# Patient Record
Sex: Female | Born: 1940 | Race: White | Hispanic: No | Marital: Married | Smoking: Never smoker
Health system: Southern US, Community
[De-identification: ages and names within clinical notes are randomized; demographics above are authoritative.]

## PROBLEM LIST (undated history)

## (undated) DIAGNOSIS — I499 Cardiac arrhythmia, unspecified: Secondary | ICD-10-CM

## (undated) DIAGNOSIS — R000 Tachycardia, unspecified: Secondary | ICD-10-CM

## (undated) DIAGNOSIS — J40 Bronchitis, not specified as acute or chronic: Secondary | ICD-10-CM

## (undated) DIAGNOSIS — R053 Chronic cough: Secondary | ICD-10-CM

## (undated) DIAGNOSIS — R05 Cough: Secondary | ICD-10-CM

## (undated) DIAGNOSIS — K5730 Diverticulosis of large intestine without perforation or abscess without bleeding: Secondary | ICD-10-CM

## (undated) DIAGNOSIS — I4891 Unspecified atrial fibrillation: Secondary | ICD-10-CM

## (undated) DIAGNOSIS — M1990 Unspecified osteoarthritis, unspecified site: Secondary | ICD-10-CM

## (undated) DIAGNOSIS — I10 Essential (primary) hypertension: Secondary | ICD-10-CM

## (undated) DIAGNOSIS — R519 Headache, unspecified: Secondary | ICD-10-CM

## (undated) DIAGNOSIS — K219 Gastro-esophageal reflux disease without esophagitis: Secondary | ICD-10-CM

## (undated) DIAGNOSIS — R011 Cardiac murmur, unspecified: Secondary | ICD-10-CM

## (undated) DIAGNOSIS — L719 Rosacea, unspecified: Secondary | ICD-10-CM

## (undated) DIAGNOSIS — R51 Headache: Secondary | ICD-10-CM

## (undated) DIAGNOSIS — R42 Dizziness and giddiness: Secondary | ICD-10-CM

## (undated) DIAGNOSIS — J189 Pneumonia, unspecified organism: Secondary | ICD-10-CM

## (undated) DIAGNOSIS — E7800 Pure hypercholesterolemia, unspecified: Secondary | ICD-10-CM

## (undated) DIAGNOSIS — G14 Postpolio syndrome: Secondary | ICD-10-CM

## (undated) DIAGNOSIS — K5790 Diverticulosis of intestine, part unspecified, without perforation or abscess without bleeding: Secondary | ICD-10-CM

## (undated) HISTORY — PX: COLON SURGERY: SHX602

## (undated) HISTORY — PX: EYE SURGERY: SHX253

## (undated) HISTORY — PX: TONSILLECTOMY: SUR1361

## (undated) HISTORY — PX: ABDOMINAL HYSTERECTOMY: SHX81

## (undated) HISTORY — PX: APPENDECTOMY: SHX54

## (undated) HISTORY — PX: OTHER SURGICAL HISTORY: SHX169

## (undated) HISTORY — DX: Postpolio syndrome: G14

## (undated) HISTORY — PX: COLON RESECTION: SHX5231

---

## 2003-12-04 ENCOUNTER — Emergency Department: Payer: Self-pay | Admitting: Emergency Medicine

## 2004-09-08 ENCOUNTER — Inpatient Hospital Stay: Payer: Self-pay | Admitting: Internal Medicine

## 2004-10-25 ENCOUNTER — Ambulatory Visit: Payer: Self-pay | Admitting: Unknown Physician Specialty

## 2007-03-28 ENCOUNTER — Ambulatory Visit: Payer: Self-pay | Admitting: Family Medicine

## 2007-11-05 ENCOUNTER — Emergency Department: Payer: Self-pay | Admitting: Emergency Medicine

## 2008-07-31 ENCOUNTER — Ambulatory Visit: Payer: Self-pay | Admitting: Internal Medicine

## 2008-11-21 ENCOUNTER — Ambulatory Visit: Payer: Self-pay | Admitting: Internal Medicine

## 2009-09-08 ENCOUNTER — Ambulatory Visit: Payer: Self-pay | Admitting: Internal Medicine

## 2009-11-12 ENCOUNTER — Ambulatory Visit: Payer: Self-pay | Admitting: Family Medicine

## 2010-07-26 ENCOUNTER — Ambulatory Visit: Payer: Self-pay | Admitting: Internal Medicine

## 2010-09-25 ENCOUNTER — Ambulatory Visit: Payer: Self-pay | Admitting: Internal Medicine

## 2010-11-22 ENCOUNTER — Ambulatory Visit: Payer: Self-pay | Admitting: Internal Medicine

## 2011-01-12 ENCOUNTER — Ambulatory Visit: Payer: Self-pay

## 2011-02-05 HISTORY — PX: HIP FRACTURE SURGERY: SHX118

## 2011-02-11 ENCOUNTER — Ambulatory Visit: Payer: Self-pay | Admitting: Family Medicine

## 2011-05-14 ENCOUNTER — Ambulatory Visit: Payer: Self-pay | Admitting: Family Medicine

## 2011-11-11 ENCOUNTER — Ambulatory Visit: Payer: Self-pay

## 2011-12-25 ENCOUNTER — Ambulatory Visit: Payer: Self-pay | Admitting: Ophthalmology

## 2012-01-01 ENCOUNTER — Ambulatory Visit: Payer: Self-pay | Admitting: Ophthalmology

## 2013-01-05 ENCOUNTER — Ambulatory Visit: Payer: Self-pay | Admitting: Family Medicine

## 2013-01-16 ENCOUNTER — Emergency Department: Payer: Self-pay | Admitting: Emergency Medicine

## 2013-04-10 ENCOUNTER — Emergency Department: Payer: Self-pay | Admitting: Internal Medicine

## 2013-04-13 LAB — BETA STREP CULTURE(ARMC)

## 2013-04-27 ENCOUNTER — Ambulatory Visit: Payer: Self-pay | Admitting: Emergency Medicine

## 2013-06-03 ENCOUNTER — Ambulatory Visit: Payer: Self-pay

## 2013-09-28 ENCOUNTER — Ambulatory Visit: Payer: Self-pay | Admitting: Family Medicine

## 2013-10-08 ENCOUNTER — Ambulatory Visit: Payer: Self-pay | Admitting: Family Medicine

## 2013-12-22 ENCOUNTER — Ambulatory Visit: Payer: Self-pay | Admitting: Physician Assistant

## 2013-12-22 LAB — URINALYSIS, COMPLETE
BILIRUBIN, UR: NEGATIVE
BLOOD: NEGATIVE
Bacteria: NEGATIVE
GLUCOSE, UR: NEGATIVE
Ketone: NEGATIVE
Leukocyte Esterase: NEGATIVE
NITRITE: NEGATIVE
Ph: 8.5 (ref 5.0–8.0)
Protein: NEGATIVE
SPECIFIC GRAVITY: 1.015 (ref 1.000–1.030)

## 2013-12-23 LAB — URINE CULTURE

## 2014-02-17 ENCOUNTER — Ambulatory Visit: Payer: Self-pay | Admitting: Family Medicine

## 2014-03-05 ENCOUNTER — Ambulatory Visit: Payer: Self-pay | Admitting: Emergency Medicine

## 2014-03-05 LAB — URINALYSIS, COMPLETE
Bilirubin,UR: NEGATIVE
Glucose,UR: NEGATIVE
KETONE: NEGATIVE
NITRITE: POSITIVE
PH: 5.5 (ref 5.0–8.0)
Protein: NEGATIVE
Specific Gravity: 1.015 (ref 1.000–1.030)
WBC UR: >30 /HPF (ref 0–5)

## 2014-03-07 LAB — URINE CULTURE

## 2014-05-24 NOTE — Op Note (Signed)
PATIENT NAME:  Kristin Nunez, Kristin Nunez MR#:  [id number] DATE OF BIRTH:  12-08-1940  DATE OF PROCEDURE:  01/01/2012  PROCEDURE PERFORMED:  1. Pars plana vitrectomy of the left eye.  2. Intravitreal injection of pharmacologic agent, left eye.   PREOPERATIVE DIAGNOSIS: Listed as vitreous hemorrhage.   POSTOPERATIVE DIAGNOSIS: Endophthalmitis.  PRIMARY SURGEON: Cline Cools, M.D.   ANESTHESIA: Local block of the left eye with monitored anesthesia care.   COMPLICATIONS: None.   INDICATIONS FOR PROCEDURE: This is a patient who presented to my office who had awakened three hours before with loss of vision in the left eye. Examination revealed trace cell in the anterior chamber and dense clouding of the posterior that seemed red in appearance consistent with vitreous hemorrhage. Risks, benefits, and alternatives of the above procedure were discussed and the patient wished to proceed.   DETAILS OF PROCEDURE: After informed consent was obtained, the patient was brought into the operative suite at [facility]. The patient was placed in the supine position, was given a small dose of propofol, and a local subconjunctival block was done on the left eye without any complications. The left eye was prepped and draped in sterile manner. After a lid speculum was inserted, the anterior chamber was examined and was noted to have significant fibrosis that had developed over the previous 1-1/2 hours. The patient was initially seen by me at 10:30. A 25-gauge blade was used to make a clear corneal wound inferotemporally. A second wound was created superonasally. The infusion line was inserted through the inferotemporal clear corneal wound and the vitrector was put through the other clear corneal wound and the fibrosis and cell was removed in order to clear the view of the back. The vitreous cutter and the infusion line were removed and the wounds were noted to be self-sealing watertight.  A trocar  was placed inferotemporally through displaced conjunctiva in an oblique fashion 3 mm beyond the limbus in the inferotemporal quadrant. The infusion cannula was turned on and inserted through the trocar and secured in position with Steri-Strips. Two more trocars were placed in a similar fashion superotemporally and superonasally. The vitreous cutter and light pipe were introduced into the eye and a core vitrectomy was performed. The vitreous face was confirmed to be elevated and the vitreous was trimmed for 360 degrees out to the vitreous base. The retina was investigated and was noted to be severely edematous with multiple hemorrhages. There were no signs of necrosis, retinal detachment, or retinal tears. The eye was examined with scleral depressed examination for 360 degrees and no signs of any breaks or tears could be found to the periphery. The infusion was confirmed to have the appropriate dosage of vancomycin in order to create 1 mg of vancomycin in the back of the eye. Trocars were removed and the wounds were closed using 6-0 plain gut. 2.25 mg of ceftazidime was injected via pars plana into the posterior. Pressure in the eye was confirmed to be approximately 10 to 15 mm mercury. Three mg of dexamethasone was given into the inferior fornix and the lid speculum was removed. The eye was cleaned and TobraDex was placed on the eye. A patch and shield were placed over the eye. The patient was taken to postanesthesia care with instructions to remain head up.      ____________________________ Ignacia Felling. Champ Mungo, MD mfa:bjt D: 01/01/2012 13:10:19 ET T: 01/01/2012 13:39:47 ET JOB#: [id number]  cc: Ignacia Felling. Champ Mungo, MD, <Dictator> Cline Cools MD  ELECTRONICALLY SIGNED 01/08/2012 6:55

## 2014-07-15 ENCOUNTER — Other Ambulatory Visit: Payer: Self-pay | Admitting: Surgery

## 2014-07-15 DIAGNOSIS — M1990 Unspecified osteoarthritis, unspecified site: Secondary | ICD-10-CM

## 2014-07-22 ENCOUNTER — Ambulatory Visit
Admission: RE | Admit: 2014-07-22 | Discharge: 2014-07-22 | Disposition: A | Discharge status: Home / Self Care | Payer: Medicare Other | Source: Ambulatory Visit | Attending: Surgery | Admitting: Surgery

## 2014-07-22 DIAGNOSIS — M1711 Unilateral primary osteoarthritis, right knee: Secondary | ICD-10-CM | POA: Insufficient documentation

## 2014-07-22 DIAGNOSIS — M1990 Unspecified osteoarthritis, unspecified site: Secondary | ICD-10-CM | POA: Diagnosis present

## 2014-07-22 DIAGNOSIS — M7121 Synovial cyst of popliteal space [Baker], right knee: Secondary | ICD-10-CM | POA: Insufficient documentation

## 2014-07-27 ENCOUNTER — Ambulatory Visit: Payer: Self-pay

## 2014-07-30 ENCOUNTER — Encounter: Payer: Self-pay | Admitting: Emergency Medicine

## 2014-07-30 ENCOUNTER — Ambulatory Visit
Admission: EM | Admit: 2014-07-30 | Discharge: 2014-07-30 | Disposition: A | Discharge status: Home / Self Care | Payer: Medicare Other | Attending: Family Medicine | Admitting: Family Medicine

## 2014-07-30 DIAGNOSIS — Z7982 Long term (current) use of aspirin: Secondary | ICD-10-CM | POA: Diagnosis not present

## 2014-07-30 DIAGNOSIS — I10 Essential (primary) hypertension: Secondary | ICD-10-CM | POA: Diagnosis not present

## 2014-07-30 DIAGNOSIS — N390 Urinary tract infection, site not specified: Secondary | ICD-10-CM | POA: Diagnosis not present

## 2014-07-30 DIAGNOSIS — R350 Frequency of micturition: Secondary | ICD-10-CM | POA: Diagnosis present

## 2014-07-30 DIAGNOSIS — I4891 Unspecified atrial fibrillation: Secondary | ICD-10-CM | POA: Insufficient documentation

## 2014-07-30 HISTORY — DX: Unspecified atrial fibrillation: I48.91

## 2014-07-30 HISTORY — DX: Tachycardia, unspecified: R00.0

## 2014-07-30 HISTORY — DX: Essential (primary) hypertension: I10

## 2014-07-30 HISTORY — DX: Diverticulosis of large intestine without perforation or abscess without bleeding: K57.30

## 2014-07-30 LAB — URINALYSIS COMPLETE WITH MICROSCOPIC (ARMC ONLY)
Bilirubin Urine: NEGATIVE
GLUCOSE, UA: NEGATIVE mg/dL
Ketones, ur: NEGATIVE mg/dL
NITRITE: NEGATIVE
PROTEIN: NEGATIVE mg/dL
SPECIFIC GRAVITY, URINE: 1.01 (ref 1.005–1.030)
pH: 6 (ref 5.0–8.0)

## 2014-07-30 MED ORDER — PHENAZOPYRIDINE HCL 100 MG PO TABS: 100.0000 mg | ORAL_TABLET | Freq: Three times a day (TID) | ORAL | Status: DC | PRN

## 2014-07-30 MED ORDER — NITROFURANTOIN MONOHYD MACRO 100 MG PO CAPS: 100.0000 mg | ORAL_CAPSULE | Freq: Two times a day (BID) | ORAL | Status: DC

## 2014-07-30 NOTE — ED Notes (Signed)
Patient presents here with c/o urinary frequency /flank pain / lower bad pain associated with burning micturation since 2 days, denies any fever

## 2014-07-30 NOTE — ED Provider Notes (Addendum)
CSN: [id number]     Arrival date & time 07/30/14  1402 History   First MD Initiated Contact with Patient 07/30/14 1440     Chief Complaint  Patient presents with  . Urinary Frequency   (Consider location/radiation/quality/duration/timing/severity/associated sxs/prior Treatment) HPI Comments: Caucasian female reported was intimate with spouse 2 days ago uses lubricant, perineum hygiene after sex, burning with urination, frequency, stronger smell to urine last UTI this winter pyridium and macrobid resolved all symptoms until now.  Has gyn appt scheduled routine for 29 Jun on premarin and vaginal cream.  Patient is a 74 y.o. female presenting with frequency. The history is provided by the patient.  Urinary Frequency This is a recurrent problem. The current episode started 2 days ago. The problem occurs constantly. The problem has been gradually worsening. Associated symptoms include abdominal pain. Pertinent negatives include no chest pain, no headaches and no shortness of breath. The symptoms are aggravated by intercourse and exertion. Nothing relieves the symptoms. She has tried rest, food and water for the symptoms. The treatment provided no relief.    Past Medical History  Diagnosis Date  . Hypertension   . Tachycardia   . Atrial fibrillation   . Diverticula of colon    Past Surgical History  Procedure Laterality Date  . Colon resection     History reviewed. No pertinent family history. History  Substance Use Topics  . Smoking status: Never Smoker   . Smokeless tobacco: Not on file  . Alcohol Use: No   OB History    No data available     Review of Systems  Constitutional: Positive for appetite change. Negative for fever, chills, diaphoresis, activity change and fatigue.  HENT: Negative for congestion, facial swelling and trouble swallowing.   Eyes: Negative for photophobia, pain, discharge, redness, itching and visual disturbance.  Respiratory: Negative for cough, choking,  shortness of breath and wheezing.   Cardiovascular: Negative for chest pain.  Gastrointestinal: Positive for abdominal pain. Negative for nausea, vomiting, diarrhea, constipation, blood in stool, abdominal distention, anal bleeding and rectal pain.  Endocrine: Negative for cold intolerance and heat intolerance.  Genitourinary: Positive for dysuria, urgency and frequency. Negative for hematuria, flank pain, decreased urine volume, vaginal bleeding, vaginal discharge, difficulty urinating, genital sores, vaginal pain, menstrual problem and pelvic pain.  Musculoskeletal: Negative for myalgias, back pain, joint swelling, arthralgias, gait problem, neck pain and neck stiffness.  Skin: Negative for color change, pallor, rash and wound.  Allergic/Immunologic: Positive for environmental allergies. Negative for food allergies.  Neurological: Negative for dizziness, tremors, facial asymmetry, weakness, light-headedness and headaches.  Hematological: Negative for adenopathy. Does not bruise/bleed easily.  Psychiatric/Behavioral: Negative for behavioral problems, confusion, sleep disturbance and agitation.    Allergies  Ciprofloxacin; Demerol; Flagyl; Meprednisone; Morphine and related; Promethazine; Sulfa antibiotics; and Toradol  Home Medications   Prior to Admission medications   Medication Sig Start Date End Date Taking? Authorizing Provider  aspirin EC 81 MG tablet Take 81 mg by mouth daily.   Yes Historical Provider, MD  diphenhydrAMINE (SOMINEX) 25 MG tablet Take 25 mg by mouth at bedtime as needed for sleep.   Yes Historical Provider, MD  meloxicam (MOBIC) 15 MG tablet Take 15 mg by mouth daily.   Yes Historical Provider, MD  metoprolol succinate (TOPROL-XL) 100 MG 24 hr tablet Take 100 mg by mouth daily. Take with or immediately following a meal.   Yes Historical Provider, MD  nitrofurantoin, macrocrystal-monohydrate, (MACROBID) 100 MG capsule Take 1 capsule (100 mg total)  by mouth 2 (two) times  daily. 07/30/14   Barbaraann Barthel, NP  phenazopyridine (PYRIDIUM) 100 MG tablet Take 1 tablet (100 mg total) by mouth 3 (three) times daily as needed for pain. 07/30/14   Jarold Song Betancourt, NP   BP 142/71 mmHg  Pulse 71  Temp(Src) 96.6 F (35.9 C) (Tympanic)  Resp 20  Ht 5\' 6"  (1.676 m)  Wt 161 lb (73.029 kg)  BMI 26.00 kg/m2  SpO2 100% Physical Exam  Constitutional: She is oriented to person, place, and time. Vital signs are normal. She appears well-developed and well-nourished.  HENT:  Head: Normocephalic and atraumatic.  Right Ear: External ear normal.  Left Ear: External ear normal.  Nose: Nose normal.  Mouth/Throat: Oropharynx is clear and moist. No oropharyngeal exudate.  Eyes: Conjunctivae, EOM and lids are normal. Pupils are equal, round, and reactive to light. Right eye exhibits no discharge. Left eye exhibits no discharge. No scleral icterus.  Neck: Normal range of motion. Neck supple. No tracheal deviation present. No thyromegaly present.  Cardiovascular: Normal rate, regular rhythm, normal heart sounds and intact distal pulses.  Exam reveals no gallop and no friction rub.   No murmur heard. Pulmonary/Chest: Effort normal and breath sounds normal. No stridor. No respiratory distress. She has no wheezes. She has no rales. She exhibits no tenderness.  Abdominal: Soft. Bowel sounds are normal. She exhibits no shifting dullness, no distension, no pulsatile liver, no fluid wave, no abdominal bruit, no ascites, no pulsatile midline mass and no mass. There is no hepatosplenomegaly. There is tenderness in the right lower quadrant, suprapubic area and left lower quadrant. There is CVA tenderness. There is no rigidity, no rebound, no guarding, no tenderness at McBurney's point and negative Murphy's sign. Hernia confirmed negative in the ventral area.  Dull to percussion x 4 quads; pressure/discomfort with RLQ/LLQ/suprapubic palpation no radiation/rebound; slight CVA discomfort bilaterally   Musculoskeletal: Normal range of motion.  Lymphadenopathy:    She has no cervical adenopathy.  Neurological: She is alert and oriented to person, place, and time. She exhibits normal muscle tone. Coordination normal.  Skin: Skin is warm, dry and intact.  Psychiatric: She has a normal mood and affect. Her speech is normal and behavior is normal. Judgment and thought content normal. Cognition and memory are normal.  Nursing note and vitals reviewed.   ED Course  Procedures (including critical care time) Labs Review Labs Reviewed  URINALYSIS COMPLETEWITH MICROSCOPIC ([facility] ONLY) - Abnormal; Notable for the following:    APPearance CLOUDY (*)    Hgb urine dipstick 2+ (*)    Leukocytes, UA 3+ (*)    Bacteria, UA MANY (*)    Squamous Epithelial / LPF 0-5 (*)    All other components within normal limits  URINE CULTURE    Imaging Review No results found.   MDM   1. UTI (lower urinary tract infection)   Plan: 1. Test/x-ray results and diagnosis reviewed with patient and given copies of lab reports 2. rx as per orders; risks, benefits, potential side effects reviewed with patient 3. Recommend supportive treatment with fluids, tylenol, pyridium, rest 4. F/u prn if symptoms worsen or don't improve within 48 hours of antibiotic use Medications as directed.  Patient is also to push fluids and may use Pyridium po as needed. Call or return to clinic as needed if these symptoms worsen or fail to improve as anticipated. Patient given copy of urinalysis results.  Discussed with patient as 3 UTIs in past 6 months  consider discussing preventive treatment with GYN or PCM.  Shower after sex.  Stay hydrated.  Void on regular basis do not hold urine when urge occurs. Urine culture results will be available in 48 hours and nurse would call if resistence to prescribed antibiotic.  Patient may call clinic Tuesday or Wednesday for results also. Exitcare handout on cystitis given to patient Patient verbalized  agreement and understanding of treatment plan and had no further questions at this time. P2:  Hydrate and cranberry juice    Barbaraann Barthel, NP 07/30/14 1559  14 Aug 2014 at 1645 patient notified via telephone urine culture results e. Coli suscecptible to nitrofurantoin.  Patient reported her symptoms had resolved.  She verbalized understanding of information and had no further questions at this time.  Barbaraann Barthel, NP 08/14/14 1645

## 2014-07-30 NOTE — Discharge Instructions (Signed)

## 2014-08-01 LAB — URINE CULTURE
Culture: >=100000
Special Requests: NORMAL

## 2014-10-07 ENCOUNTER — Ambulatory Visit: Payer: Medicare Other | Attending: Urology | Admitting: Physical Therapy

## 2014-10-07 ENCOUNTER — Encounter: Payer: Self-pay | Admitting: Physical Therapy

## 2014-10-07 DIAGNOSIS — R279 Unspecified lack of coordination: Secondary | ICD-10-CM | POA: Insufficient documentation

## 2014-10-07 DIAGNOSIS — R531 Weakness: Secondary | ICD-10-CM | POA: Insufficient documentation

## 2014-10-07 DIAGNOSIS — M629 Disorder of muscle, unspecified: Secondary | ICD-10-CM | POA: Diagnosis not present

## 2014-10-07 NOTE — Patient Instructions (Addendum)
      Increase 1 glass of water to 3 /day and decrease coffee from 3 to 2 /day

## 2014-10-08 NOTE — Therapy (Signed)
[facility] MAIN [facility] SERVICES [address] [place], [place], [address] Phone: [phone]   Fax:  [phone]  Physical Therapy Evaluation  Patient Details  Name: Kristin Nunez MRN: [id number] Date of Birth: 1940-09-04 Referring Provider:  Riki Altes, MD  Encounter Date: 10/07/2014      PT End of Session - 10/08/14 0832    Visit Number 1   Number of Visits 12   Date for PT Re-Evaluation 12/30/14   Authorization Type G-code 10th visit   PT Start Time 1020   PT Stop Time 1115   PT Time Calculation (min) 55 min   Activity Tolerance Patient tolerated treatment well;No increased pain   Behavior During Therapy [facility] for tasks assessed/performed      Past Medical History  Diagnosis Date  . Hypertension   . Tachycardia   . Atrial fibrillation   . Diverticula of colon   . 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 Colon resection    . Abdominal hysterectomy      prolapsed uterus   . Trigger thumb release       R  . Hip fracture surgery Right 2013    from a fall, also fx R shoulder: (plate/pins present in UE, pins LE)   . Eye surgery Left     cataract     There were no vitals filed for this visit.  Visit Diagnosis:  Fascial defect - Plan: PT plan of care cert/re-cert  Weakness - Plan: PT plan of care cert/re-cert  Lack of coordination - Plan: PT plan of care cert/re-cert      Subjective Assessment - 10/07/14 1033    Subjective  1) urge incontinence and frequency (1 hr) , leakage with sit-to stand  due to slowed rise from knee issues associated with Post-Polio, Pad change for fecal and urinary incontinece 2x/day, nocturia: 2x. Fluids: 3-4 cups coffee/day, 1 cup juice, 3 - 8 oz water/day  2) constipation: with Miralax, type 1 4-5 x/night with difficulty emptying.    Pertinent History Hx of colon resection (to treat diverticulitis) and has had only one attack  since post-surgery, Hx of complete hysterectemy, post-polio, R hip surgery from Fx,.  Current routine: 45-90 min swimming 3x/wk    Patient Stated Goals increase mm strength / control to make it to the bathroom             [facility] PT Assessment - 10/08/14 0824    Observation/Other Assessments   Other Surveys  --  PFDI: 54.5% (lower % indicate better function)   Sit to Stand   Comments 32:23 sec  genu valgus and heavy use of UE support on arm, breathholding   Posture/Postural Control   Posture Comments chest breathing dominant   Strength   Overall Strength Comments L LE 3/5, R4/5    Palpation   Palpation comment LQ abdominal scar poor immobility   Ambulation/Gait   Gait Comments mild scissoring w/ mild LBO w/ ability to self-correct                   [facility] Adult PT Treatment/Exercise - 10/08/14 0824    Self-Care   Other Self-Care Comments  bladder irritants to water ration w/ education to avoid dehydration, hydration is important for resolving her Sx   Therapeutic Activites    ADL's proper toileting posture and breathing   Neuro  Re-ed    Neuro Re-ed Details  sit to stand , exhale on rise                PT Education - 11-07-14 0829    Education provided Yes   Education Details HEP, POC, anatomy, physiology, goals   Person(s) Educated Patient   Methods Explanation;Demonstration;Tactile cues;Verbal cues;Handout   Comprehension Verbalized understanding;Returned demonstration             PT Long Term Goals - Nov 07, 2014 1610    PT LONG TERM GOAL #1   Title Pt will decrease her score on PFDI from 53.5% to less than 45% to demo improved pelvic floor function to perform ADLs.   Time 12   Period Weeks   Status New   PT LONG TERM GOAL #2   Title Pt will decrease her time with sit-to-stand from 32 sec to < 30 sec with UE support in order to access her toilet w/ decreased risk for falls.   Time 12   Period Weeks   Status New   PT LONG TERM GOAL #3   Title  Pt will report compliance with toileting posture and bladder irritant to water ratio recommendations to improve urinary frequency/ constipation and decrease risk of dehydration/hospitalization.    Time 12   Period Weeks   Status New               Plan - Nov 07, 2014 9604    Clinical Impression Statement Pt is a 74 yo female who c/o pelvic floor dysfunctions w/ urinary and bowel Sx (urge incontinence, SUI, difficulty completely emptying bowels, constipation) . Her S & Sx consist of weak hip strength, difficulty w/ sit-to-stand, poor bladder health and toileting habits,  limited diaphragmatic breathing due to dominant chest breathing, and restricted fascia over abdomen.   Internal pelvic floor exam will be performed at next visit due to limited time from pt's late arrival. These deficits impact her sleep and balance as she has frequent urination and are dependent on pads. Pt is at risk for falls.      Pt will benefit from skilled therapeutic intervention in order to improve on the following deficits Abnormal gait;Decreased activity tolerance;Decreased balance;Decreased mobility;Decreased strength;Postural dysfunction;Improper body mechanics;Decreased scar mobility;Hypomobility;Pain;Difficulty walking;Decreased coordination;Decreased range of motion;Decreased safety awareness;Decreased endurance;Increased fascial restricitons   Rehab Potential Good   Clinical Impairments Affecting Rehab Potential Pt's Hx of hysterectemy due to pelvic organ prolapse, post-polio syndrome, and colon resection surgery    PT Frequency 1x / week   PT Duration 12 weeks   PT Treatment/Interventions ADLs/Self Care Home Management;Cryotherapy;Biofeedback;Electrical Stimulation;Functional mobility training;Stair training;Gait training;Traction;Moist Heat;Therapeutic activities;Therapeutic exercise;Balance training;Neuromuscular re-education;Patient/family education;Passive range of motion;Scar mobilization;Manual techniques;Dry  needling;Taping   PT Next Visit Plan inernal assessment   Consulted and Agree with Plan of Care Patient          G-Codes - November 07, 2014 0847    Functional Assessment Tool Used PFDI: 53%    Functional Limitation Self care   Self Care Current Status (V4098) At least 40 percent but less than 60 percent impaired, limited or restricted   Self Care Goal Status (J1914) At least 20 percent but less than 40 percent impaired, limited or restricted       Problem List There are no active problems to display for this patient.   [name] ,PT, DPT, E-RYT  07-Nov-2014, 8:50 AM  [facility] MAIN [facility] SERVICES [address] [place], [place], [address] Phone: [phone]   Fax:  [phone]     

## 2014-10-17 ENCOUNTER — Ambulatory Visit: Payer: Medicare Other | Admitting: Physical Therapy

## 2014-10-17 DIAGNOSIS — M629 Disorder of muscle, unspecified: Secondary | ICD-10-CM

## 2014-10-17 DIAGNOSIS — R531 Weakness: Secondary | ICD-10-CM

## 2014-10-17 DIAGNOSIS — R279 Unspecified lack of coordination: Secondary | ICD-10-CM

## 2014-10-17 NOTE — Patient Instructions (Signed)
  Abdominal massage (every night ) with olive oil (handout)     PELVIC FLOOR / KEGEL EXERCISES  Pelvic floor/ Kegel exercises are used to strengthen the muscles in the base of your pelvis that are responsible for supporting your pelvic organs and preventing urine/feces leakage. Based on your therapist's recommendations, they can be performed while standing, sitting, or lying down.  Make yourself aware of this muscle group by using these cues:  Imagine you are in a crowded room and you feel the need to pass gas. Your response is to pull up and in at the rectum.  Close the rectum. Pull the muscles up inside your body,feeling your vaginal walls lifting as well . Feel the pelvic floor muscles lift  Place your hand on top of your pubic bone. Tighten and draw in the muscles around the anal muscles without squeezing the buttock muscles. Common Errors:  Breath holding: If you are holding your breath, you may be bearing down against your bladder instead of pulling it up. If you belly bulges up while you are squeezing, you are holding your breath. Be sure to breathe gently in and out while exercising. Counting out loud may help you avoid holding your breath.  Accessory muscle use: You should not see or feel other muscle movement when performing pelvic floor exercises. When done properly, no one can tell that you are performing the exercises. Keep the buttocks, belly and inner thighs relaxed.  Overdoing it: Your muscles can fatigue and stop working for you if you over-exercise. You may actually leak more or feel soreness at the lower abdomen or rectum.  YOUR HOME EXERCISE PROGRAM  LONG HOLDS: Position: on back with pillow under hips   Inhale and then exhale. Then squeeze the muscle and count aloud for 10 seconds. Rest with three long breaths. (Be sure to let belly sink in with exhales and not push outward)   Perform 7repetitions, 3 times/day       WAYS TO MINIMIZE STRAIN ON PELVIC FLOOR, ABDOMINALS,                     LOW BACK MUSCLES. PRESERVE YOUR PELVIC HEALTH!   **SQUEEZE BEFORE YOUR SNEEZE, COUGH, LAUGH to decrease downward pressure  ** EXHALE BEFORE YOU RISE AGAINST GRAVITY (lifting, sit to stand, from squat to stand)  **LOG ROLL out of bed instead of performing a crunch/sit-up

## 2014-10-17 NOTE — Therapy (Signed)
[place] [facility] MAIN [facility] SERVICES [address] [place], [place], [address] Phone: [phone]   Fax:  [phone]  Physical Therapy Treatment  Patient Details  Name: Kristin Nunez MRN: [id number] Date of Birth: Jun 05, 1940 Referring Provider:  Riki Altes, MD  Encounter Date: 10/17/2014      PT End of Session - 10/17/14 1419    Visit Number 2   Number of Visits 12   Date for PT Re-Evaluation 12/30/14   Authorization Type G-code 10th visit   PT Start Time 1020   PT Stop Time 1130   PT Time Calculation (min) 70 min   Activity Tolerance Patient tolerated treatment well;No increased pain   Behavior During Therapy [facility] for tasks assessed/performed      Past Medical History  Diagnosis Date  . Hypertension   . Tachycardia   . Atrial fibrillation   . Diverticula of colon   . 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 Colon resection    . Abdominal hysterectomy      prolapsed uterus   . Trigger thumb release       R  . Hip fracture surgery Right 2013    from a fall, also fx R shoulder: (plate/pins present in UE, pins LE)   . Eye surgery Left     cataract     There were no vitals filed for this visit.  Visit Diagnosis:  Weakness  Fascial defect  Lack of coordination      Subjective Assessment - 10/17/14 1114    Subjective Pt has been practicing proper sitting posture. Pt reports getting out of the chair continues to be difficulty. Pt reported she is considering knee surgery due to arthritis.    Pertinent History Hx of colon resection (to treat diverticulitis) and has had only one attack post-surgery, Hx of complete hysterectemy, post-polio, R hip surgery from Fx,.  Current routine: 45-90 min swimming 3x/wk    Patient Stated Goals increase mm strength / control to make it to the bathroom             [facility] PT Assessment - 10/17/14 1145    Ambulation/Gait   Gait Comments 10 m : 15.6 sec  w/o SPC  16.38 sec SPC regularly L hand                  Pelvic Floor Special Questions - 10/17/14 1141    Skin Integrity Intact   Prolapse Posterior Wall  within introitus   Exam Type Vaginal   Palpation observed posterior pelvic organ lowering within introitus with cue for cough, able to lift with contraction during cough, no mm tenderness   Strength good squeeze, good lift, able to hold agaisnt strong resistance   Strength # of reps 7  long holds 7, quicks 6   Strength # of seconds 10           [facility] Adult PT Treatment/Exercise - 10/17/14 1145    Bed Mobility   Bed Mobility --  half crunch, required 4 trials for log roll w. excessive cue   Self-Care   Other Self-Care Comments  advised pt to walk w/ SPC regularly to minimize risk for falls     Neuro Re-ed    Neuro Re-ed Details  pelvic floor coordination with coughing/contraction and pelvic floor long holds, cues for decreased overuse of excessive mm (adductor,  back, and gluts)    Manual Therapy   Manual therapy comments abdominal massage   guided pt as well to perform on self   Myofascial Release scar massage along suprapubic area, noted decreased "pulling sensation" post-Tx  and increased fascial mobility   jostling                 PT Education - 10/17/14 1304    Education provided Yes   Education Details HEP and regular use of SPC    Person(s) Educated Patient   Methods Explanation;Demonstration;Tactile cues;Verbal cues;Handout   Comprehension Verbalized understanding;Returned demonstration             PT Long Term Goals - 10/17/14 2123    PT LONG TERM GOAL #1   Title Pt will decrease her score on PFDI from 53.5% to less than 45% to demo improved pelvic floor function to perform ADLs.   Time 12   Period Weeks   Status New   PT LONG TERM GOAL #2   Title Pt will decrease her time with sit-to-stand from 32 sec to < 30 sec with UE support in  order to access her toilet w/ decreased risk for falls.   Time 12   Period Weeks   Status New   PT LONG TERM GOAL #3   Title Pt will report compliance with toileting posture and bladder irritant to water ratio recommendations to improve urinary frequency/ constipation and decrease risk of dehydration/hospitalization.    Time 12   Period Weeks   Status New   PT LONG TERM GOAL #4   Title Pt will demo increased gait speed from 0.64 m/s to >.70 m/s with use SPC in order to decrease risk for falls.    Time 12   Period Weeks   Status New               Plan - 10/17/14 2126    Clinical Impression Statement Pt demo'd good carry over with proper breathing and was able to progress to pelvic floor strengthening. Pt required hips to be elevated on a pillow to elicit circumferential contraction 2/2 abnormal postions of posterior pelvic organs.  Initiated scar and abdominal massage to address increased scar adhesions along LQ abdomen. Pt was advised to u se SPC on a regular basis to promote safety and minimize risk for falls.     Pt will benefit from skilled therapeutic intervention in order to improve on the following deficits Abnormal gait;Decreased activity tolerance;Decreased balance;Decreased mobility;Decreased strength;Postural dysfunction;Improper body mechanics;Decreased scar mobility;Hypomobility;Pain;Difficulty walking;Decreased coordination;Decreased range of motion;Decreased safety awareness;Decreased endurance;Increased fascial restricitons   Rehab Potential Good   Clinical Impairments Affecting Rehab Potential Pt's Hx of hysterectemy due to pelvic organ prolapse, post-polio syndrome, and colon resection surgery    PT Frequency 1x / week   PT Duration 12 weeks   PT Treatment/Interventions ADLs/Self Care Home Management;Cryotherapy;Biofeedback;Electrical Stimulation;Functional mobility training;Stair training;Gait training;Traction;Moist Heat;Therapeutic activities;Therapeutic  exercise;Balance training;Neuromuscular re-education;Patient/family education;Passive range of motion;Scar mobilization;Manual techniques;Dry needling;Taping   PT Next Visit Plan assess glut strength, glut strengthening         Problem List There are no active problems to display for this patient.   [name] ,PT, DPT, E-RYT  10/17/2014, 9:32 PM  [place] [facility] MAIN [facility] SERVICES [address] [place], [place], [address] Phone: [phone]   Fax:  [phone]

## 2014-10-24 ENCOUNTER — Ambulatory Visit: Payer: Medicare Other | Admitting: Physical Therapy

## 2014-10-28 ENCOUNTER — Ambulatory Visit: Payer: Medicare Other | Admitting: Physical Therapy

## 2014-10-31 ENCOUNTER — Ambulatory Visit: Payer: Medicare Other | Admitting: Physical Therapy

## 2014-10-31 DIAGNOSIS — R279 Unspecified lack of coordination: Secondary | ICD-10-CM

## 2014-10-31 DIAGNOSIS — R531 Weakness: Secondary | ICD-10-CM

## 2014-10-31 DIAGNOSIS — M629 Disorder of muscle, unspecified: Secondary | ICD-10-CM

## 2014-11-01 NOTE — Patient Instructions (Signed)
Reverse kegel handout Toileting technique handout

## 2014-11-01 NOTE — Therapy (Signed)
[place] [facility] MAIN [facility] SERVICES [address] [place], [place], [address] Phone: [phone]   Fax:  [phone]  Physical Therapy Treatment  Patient Details  Name: Kristin Nunez MRN: [id number] Date of Birth: 04-15-1940 Referring Provider:  Riki Altes, MD  Encounter Date: 10/31/2014      PT End of Session - 11/01/14 2108    Visit Number 3   Number of Visits 12   Date for PT Re-Evaluation 12/30/14   Authorization Type G-code 10th visit   PT Start Time 1105   PT Stop Time 1205   PT Time Calculation (min) 60 min   Activity Tolerance Patient tolerated treatment well;No increased pain   Behavior During Therapy [facility] for tasks assessed/performed      Past Medical History  Diagnosis Date  . Hypertension   . Tachycardia   . Atrial fibrillation   . Diverticula of colon   . 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 Colon resection    . Abdominal hysterectomy      prolapsed uterus   . Trigger thumb release       R  . Hip fracture surgery Right 2013    from a fall, also fx R shoulder: (plate/pins present in UE, pins LE)   . Eye surgery Left     cataract     There were no vitals filed for this visit.  Visit Diagnosis:  Weakness  Fascial defect  Lack of coordination          [facility] PT Assessment - 11/01/14 2104    Observation/Other Assessments   Observations initally pt demo'd chest breathing, paradoxical activation of pelvic floor mm                  Pelvic Floor Special Questions - 11/01/14 2105    Exam Type Rectal   Palpation increased scar restriction around EAS and anal canal anterior L  increased tightness,puborectalis mm tensions posterior    Biofeedback abdominal straining, downward pressure on pelvici floor w/ exhalation through mouth.             [facility] Adult PT Treatment/Exercise - 11/01/14 2105    Neuro Re-ed    Neuro  Re-ed Details  pelvic floor breathing coordination, emphasis on relaxation   Manual Therapy   Myofascial Release rectal scar releases                 PT Education - 11/01/14 2107    Education provided Yes   Education Details HEP   Person(s) Educated Patient   Methods Explanation;Demonstration;Tactile cues;Verbal cues;Handout   Comprehension Verbalized understanding;Returned demonstration             PT Long Term Goals - 11/01/14 2111    PT LONG TERM GOAL #1   Title Pt will decrease her score on PFDI from 53.5% to less than 45% to demo improved pelvic floor function to perform ADLs.   Time 12   Period Weeks   Status New   PT LONG TERM GOAL #2   Title Pt will decrease her time with sit-to-stand from 32 sec to < 30 sec with UE support in order to access her toilet w/ decreased risk for falls.   Time 12   Period Weeks   Status On-going   PT LONG TERM GOAL #3   Title Pt will report compliance with toileting  posture and bladder irritant to water ratio recommendations to improve urinary frequency/ constipation and decrease risk of dehydration/hospitalization.    Time 12   Period Weeks   Status On-going   PT LONG TERM GOAL #4   Title Pt will demo increased gait speed from 0.64 m/s to >.70 m/s with use SPC in order to decrease risk for falls.    Time 12   Period Weeks   Status On-going               Plan - 11/01/14 2108    Clinical Impression Statement Pt achieved proper pelvic floor/ diaphragmatic coordination post-Tx, slightly increased rectal scar mobility/ mm tensions without complaints to intrarectal manual Tx.    Pt will benefit from skilled therapeutic intervention in order to improve on the following deficits Abnormal gait;Decreased activity tolerance;Decreased balance;Decreased mobility;Decreased strength;Postural dysfunction;Improper body mechanics;Decreased scar mobility;Hypomobility;Pain;Difficulty walking;Decreased coordination;Decreased range of  motion;Decreased safety awareness;Decreased endurance;Increased fascial restricitons   Rehab Potential Good   Clinical Impairments Affecting Rehab Potential Pt's Hx of hysterectemy due to pelvic organ prolapse, post-polio syndrome, and colon resection surgery    PT Frequency 1x / week   PT Duration 12 weeks   PT Treatment/Interventions ADLs/Self Care Home Management;Cryotherapy;Biofeedback;Electrical Stimulation;Functional mobility training;Stair training;Gait training;Traction;Moist Heat;Therapeutic activities;Therapeutic exercise;Balance training;Neuromuscular re-education;Patient/family education;Passive range of motion;Scar mobilization;Manual techniques;Dry needling;Taping   PT Next Visit Plan assess glut strength, glut strengthening         Problem List There are no active problems to display for this patient.   [name]  ,PT, DPT, E-RYT  11/01/2014, 9:12 PM  [place] [facility] MAIN [facility] SERVICES [address] [place], [place], [address] Phone: [phone]   Fax:  [phone]

## 2014-11-02 ENCOUNTER — Ambulatory Visit: Payer: Medicare Other | Admitting: Physical Therapy

## 2014-11-02 DIAGNOSIS — R531 Weakness: Secondary | ICD-10-CM

## 2014-11-02 DIAGNOSIS — M629 Disorder of muscle, unspecified: Secondary | ICD-10-CM

## 2014-11-02 DIAGNOSIS — R279 Unspecified lack of coordination: Secondary | ICD-10-CM

## 2014-11-03 NOTE — Therapy (Signed)
[place] [facility] MAIN [facility] SERVICES [address] [place], [place], [address] Phone: [phone]   Fax:  [phone]  Physical Therapy Treatment  Patient Details  Name: Kristin Nunez MRN: [id number] Date of Birth: 01-Sep-1940 Referring Provider:  Riki Altes, MD  Encounter Date: 11/02/2014      PT End of Session - 11/03/14 1053    Visit Number 4   Number of Visits 12   Date for PT Re-Evaluation 12/30/14   Authorization Type G-code 10th visit   PT Start Time 1106   PT Stop Time 1205   PT Time Calculation (min) 59 min   Activity Tolerance Patient tolerated treatment well;No increased pain   Behavior During Therapy [facility] for tasks assessed/performed      Past Medical History  Diagnosis Date  . Hypertension   . Tachycardia   . Atrial fibrillation   . Diverticula of colon   . 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 Colon resection    . Abdominal hysterectomy      prolapsed uterus   . Trigger thumb release       R  . Hip fracture surgery Right 2013    from a fall, also fx R shoulder: (plate/pins present in UE, pins LE)   . Eye surgery Left     cataract     There were no vitals filed for this visit.  Visit Diagnosis:  Weakness  Lack of coordination  Fascial defect      Subjective Assessment - 11/02/14 1125    Subjective After last session, ptt reported she was able to notice when she had a difficult time with elimination of bowels, she practiced breathing and her pelvic floor relaxed. Pt also propped her foot up.     Pertinent History Hx of colon resection (to treat diverticulitis) and has had only one attack post-surgery, Hx of complete hysterectemy, post-polio, R hip surgery from Fx,.  Current routine: 45-90 min swimming 3x/wk    Patient Stated Goals increase mm strength / control to make it to the bathroom             [facility] PT Assessment -  11/03/14 0001    Palpation   Palpation comment significant intrarectal scar with limited mobility from EAS through anal canal below pelvic floor mm  increased scar mobility, anal mm relaxation improved post-Tx                  Pelvic Floor Special Questions - 11/03/14 1049    Exam Type Rectal   Palpation digital insertion to pelvic floor mm less difficult today,   improved pelvic floor ROM compared to last session   Biofeedback improved coordination w/ breathing           [facility] Adult PT Treatment/Exercise - 11/03/14 1053    Neuro Re-ed    Neuro Re-ed Details  cues for decreased bearing down at the end of exhalation   Exercises   Other Exercises  Uni UE support: double heel raises w/ c/o of worry that her knees will "give out": after 3 reps. modified to semi-tandem stance (back foot heel raise wi/ unilateral UE support)  5 reps  pt continued to show difficulty, withheld from HEP   Manual Therapy   Myofascial Release rectal scar releases  PT Education - 11/03/14 1052    Education provided Yes   Education Details HEP   Person(s) Educated Patient   Methods Explanation;Demonstration;Tactile cues;Verbal cues;Handout   Comprehension Verbalized understanding;Returned demonstration             PT Long Term Goals - 11/01/14 2111    PT LONG TERM GOAL #1   Title Pt will decrease her score on PFDI from 53.5% to less than 45% to demo improved pelvic floor function to perform ADLs.   Time 12   Period Weeks   Status New   PT LONG TERM GOAL #2   Title Pt will decrease her time with sit-to-stand from 32 sec to < 30 sec with UE support in order to access her toilet w/ decreased risk for falls.   Time 12   Period Weeks   Status On-going   PT LONG TERM GOAL #3   Title Pt will report compliance with toileting posture and bladder irritant to water ratio recommendations to improve urinary frequency/ constipation and decrease risk of  dehydration/hospitalization.    Time 12   Period Weeks   Status On-going   PT LONG TERM GOAL #4   Title Pt will demo increased gait speed from 0.64 m/s to >.70 m/s with use SPC in order to decrease risk for falls.    Time 12   Period Weeks   Status On-going               Plan - 11/03/14 1056    Clinical Impression Statement Pt reported no complaints w/ intrarectal manual Tx ro release scar restrictions and demo'd improved anal opening with proper coordinated breathing. Pt demo'd improvement with decreased anal tighness in distal end of anal canal while the caudal end continued to remain tightened/ blocked by what felt to be the descend of pelvic organ again anterior wall of rectum. Pt was able to demo more circumferential anal canal contraction with correct exhalation coordination with a proper pelvic floor lift.  Pt wil continue to benefit from skilled PT.   Pt will benefit from skilled therapeutic intervention in order to improve on the following deficits Abnormal gait;Decreased activity tolerance;Decreased balance;Decreased mobility;Decreased strength;Postural dysfunction;Improper body mechanics;Decreased scar mobility;Hypomobility;Pain;Difficulty walking;Decreased coordination;Decreased range of motion;Decreased safety awareness;Decreased endurance;Increased fascial restricitons   Rehab Potential Good   Clinical Impairments Affecting Rehab Potential Pt's Hx of hysterectemy due to pelvic organ prolapse, post-polio syndrome, and colon resection surgery    PT Frequency 1x / week   PT Duration 12 weeks   PT Treatment/Interventions ADLs/Self Care Home Management;Cryotherapy;Biofeedback;Electrical Stimulation;Functional mobility training;Stair training;Gait training;Traction;Moist Heat;Therapeutic activities;Therapeutic exercise;Balance training;Neuromuscular re-education;Patient/family education;Passive range of motion;Scar mobilization;Manual techniques;Dry needling;Taping   PT Next Visit  Plan assess glut strength, glut strengthening in supine or sidelying   Consulted and Agree with Plan of Care Patient        Problem List There are no active problems to display for this patient.   [name]  ,PT, DPT, E-RYT  11/03/2014, 11:02 AM  [place] [facility] MAIN [facility] SERVICES [address] [place], [place], [address] Phone: [phone]   Fax:  [phone]

## 2014-11-03 NOTE — Patient Instructions (Signed)
Continue practicing breathing coordination, relaxing pelvic floor mm

## 2014-11-07 ENCOUNTER — Ambulatory Visit: Payer: Medicare Other | Attending: Urology | Admitting: Physical Therapy

## 2014-11-07 DIAGNOSIS — R531 Weakness: Secondary | ICD-10-CM | POA: Diagnosis not present

## 2014-11-07 DIAGNOSIS — R279 Unspecified lack of coordination: Secondary | ICD-10-CM | POA: Insufficient documentation

## 2014-11-07 DIAGNOSIS — M629 Disorder of muscle, unspecified: Secondary | ICD-10-CM | POA: Diagnosis present

## 2014-11-07 NOTE — Patient Instructions (Signed)
Emailed body scan audio Practice seated 3x/ day

## 2014-11-07 NOTE — Therapy (Signed)
[place] [facility] MAIN [facility] SERVICES [address] [place], [place], [address] Phone: [phone]   Fax:  [phone]  Physical Therapy Treatment  Patient Details  Name: Kristin Nunez MRN: [id number] Date of Birth: 1940-03-26 Referring Provider:  Riki Altes, MD  Encounter Date: 11/07/2014      PT End of Session - 11/07/14 1326    Visit Number 5   Number of Visits 12   Date for PT Re-Evaluation 12/30/14   Authorization Type G-code 10th visit   PT Start Time 1117   PT Stop Time 1200   PT Time Calculation (min) 43 min   Activity Tolerance Patient tolerated treatment well;No increased pain   Behavior During Therapy [facility] for tasks assessed/performed      Past Medical History  Diagnosis Date  . Hypertension   . Tachycardia   . Atrial fibrillation   . Diverticula of colon   . 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 Colon resection    . Abdominal hysterectomy      prolapsed uterus   . Trigger thumb release       R  . Hip fracture surgery Right 2013    from a fall, also fx R shoulder: (plate/pins present in UE, pins LE)   . Eye surgery Left     cataract     There were no vitals filed for this visit.  Visit Diagnosis:  Weakness  Lack of coordination  Fascial defect      Subjective Assessment - 11/07/14 1132    Subjective Pt reported she was able to perform the pelvic floor coordination correctly a few times but notices she loses focus. Pt has been concerned about her focus in general. Pt also recognized her body tensions are associated with life events that she has been through from childhood to recent times.  Pt reconginizes she is wanting to stay motivated, disciplined,  and focus on health and wellness.  Pt reported she is eliminating easier with proper technique but still has difficulty completely eliminating. She tries not to strain.  Pt notices she is  able to lift her pelvic floor up with breathing.     Pertinent History Hx of colon resection (to treat diverticulitis) and has had only one attack post-surgery, Hx of complete hysterectemy, post-polio, R hip surgery from Fx,.  Current routine: 45-90 min swimming 3x/wk    Patient Stated Goals increase mm strength / control to make it to the bathroom             [facility] PT Assessment - 11/07/14 1324    Coordination   Gross Motor Movements are Fluid and Coordinated --  improved: less chest breathing, more abd lift up and in   Posture/Postural Control   Posture Comments decreased chest breathing, improved diaphragmtic breathing with abdominal draw in.  Proper sitting posture without cuing                     [facility] Adult PT Treatment/Exercise - 11/07/14 1324    Therapeutic Activites    ADL's sit to stand w/o donning purse, hands free for safety and abalance   moderate cuing                PT Education - 11/07/14 1326    Education provided Yes   Education Details HEP   Person(s) Educated Patient  Methods Explanation;Demonstration;Tactile cues;Verbal cues;Handout   Comprehension Verbalized understanding;Returned demonstration             PT Long Term Goals - 11/07/14 1130    PT LONG TERM GOAL #1   Title Pt will decrease her score on PFDI from 53.5% to less than 45% to demo improved pelvic floor function to perform ADLs.   Time 12   Period Weeks   Status On-going   PT LONG TERM GOAL #2   Title Pt will decrease her time with sit-to-stand from 32 sec to < 30 sec with UE support in order to access her toilet w/ decreased risk for falls.   Time 12   Period Weeks   Status On-going   PT LONG TERM GOAL #3   Title Pt will report compliance with toileting posture and bladder irritant to water ratio recommendations to improve urinary frequency/ constipation and decrease risk of dehydration/hospitalization.    Time 12   Period Weeks   Status On-going   PT LONG  TERM GOAL #4   Title Pt will demo increased gait speed from 0.64 m/s to >.70 m/s with use SPC in order to decrease risk for falls.    Time 12   Period Weeks   Status On-going               Plan - 11/07/14 1327    Clinical Impression Statement Pt demo'd improved deep core coordination compared to last session. Applied biopsychosocial approach and provided educaiton on releaxation of pelvic floor and importance of building deep core strength w/ explanation of pt's surgerical Hx and pt's personal story about childhood constipation 2/2 stress. Utilized anatomy photos to enhance understanding and body scan technique to build mindfulness/ focus/ maintain decreased holding patterns. Resume internal rectal assessment and begin BLE strengthening.     Pt will benefit from skilled therapeutic intervention in order to improve on the following deficits Abnormal gait;Decreased activity tolerance;Decreased balance;Decreased mobility;Decreased strength;Postural dysfunction;Improper body mechanics;Decreased scar mobility;Hypomobility;Pain;Difficulty walking;Decreased coordination;Decreased range of motion;Decreased safety awareness;Decreased endurance;Increased fascial restricitons   Rehab Potential Good   Clinical Impairments Affecting Rehab Potential Pt's Hx of hysterectemy due to pelvic organ prolapse, post-polio syndrome, and colon resection surgery    PT Frequency 1x / week   PT Duration 12 weeks   PT Treatment/Interventions ADLs/Self Care Home Management;Cryotherapy;Biofeedback;Electrical Stimulation;Functional mobility training;Stair training;Gait training;Traction;Moist Heat;Therapeutic activities;Therapeutic exercise;Balance training;Neuromuscular re-education;Patient/family education;Passive range of motion;Scar mobilization;Manual techniques;Dry needling;Taping   PT Next Visit Plan assess glut strength, glut strengthening in supine or sidelying   Consulted and Agree with Plan of Care Patient         Problem List There are no active problems to display for this patient.   [name] ,PT, DPT, E-RYT  11/07/2014, 1:38 PM  [place] [facility] MAIN [facility] SERVICES [address] [place], [place], [address] Phone: [phone]   Fax:  [phone]

## 2014-11-09 ENCOUNTER — Ambulatory Visit: Payer: Medicare Other | Admitting: Physical Therapy

## 2014-11-09 DIAGNOSIS — R531 Weakness: Secondary | ICD-10-CM | POA: Diagnosis not present

## 2014-11-09 DIAGNOSIS — M629 Disorder of muscle, unspecified: Secondary | ICD-10-CM

## 2014-11-09 DIAGNOSIS — R279 Unspecified lack of coordination: Secondary | ICD-10-CM

## 2014-11-09 NOTE — Patient Instructions (Addendum)
Inhale, feel the ballooning effect of pelvic floor  For eliminating bowel movements    Bridge with yellow band on thighs  Inhale do nothing, exhale, lift hips  10 reps x 2 sets/ 2 x days   Body scan before bed    Fluid changes: Replace teccino (coffe alternative ) during 5-6pm, decrease herbal tea to 1 cup (take chamomille),  Use Sleep y time Tea at 9pm (2 hrs before bed)

## 2014-11-10 NOTE — Therapy (Signed)
[facility] MAIN [facility] SERVICES [address] [place], [place], [address] Phone: [phone]   Fax:  [phone]  Physical Therapy Treatment  Patient Details  Name: Kristin Nunez MRN: [id number] Date of Birth: [birth year] Referring Provider:  Riki Altes, MD  Encounter Date: 11/09/2014      PT End of Session - 11/10/14 2331    Visit Number 6   Number of Visits 12   Date for PT Re-Evaluation 12/30/14   Authorization Type G-code 10th visit   PT Start Time 1415   PT Stop Time 1520   PT Time Calculation (min) 65 min   Activity Tolerance Patient tolerated treatment well;No increased pain   Behavior During Therapy [facility] for tasks assessed/performed      Past Medical History  Diagnosis Date  . Hypertension   . Tachycardia   . Atrial fibrillation   . Diverticula of colon   . 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 Colon resection    . Abdominal hysterectomy      prolapsed uterus   . Trigger thumb release       R  . Hip fracture surgery Right 2013    from a fall, also fx R shoulder: (plate/pins present in UE, pins LE)   . Eye surgery Left     cataract     There were no vitals filed for this visit.  Visit Diagnosis:  Weakness  Lack of coordination  Fascial defect      Subjective Assessment - 11/10/14 2349    Subjective Pt reported practicing the body scan (mindfulness technique)  and noticed she eliminated better and more easily with a feeling of completing eliminating 70%.  Pt reported Pt reported she has more urinary frequency and reports  drinking 2 coffee cups in the morning, decaf  cup of coffee,  2 cups of herbal,  and 5 cups of water per day.    Pertinent History Hx of colon resection (to treat diverticulitis) and has had only one attack post-surgery, Hx of complete hysterectemy, post-polio, R hip surgery from Fx,.  Current routine: 45-90 min  swimming 3x/wk    Patient Stated Goals increase mm strength / control to make it to the bathroom             [facility] PT Assessment - 11/10/14 2312    Squat   Comments difficulty, downgraded to bridging                   Pelvic Floor Special Questions - 11/10/14 2310    Exam Type Rectal   Palpation digital insertion to pelvic floor mm less difficult today  improved pelvic floor lift    Biofeedback improved coordination w/ breathing  cuing once to correct paradoxical coordination           [facility] Adult PT Treatment/Exercise - 11/10/14 2322    Self-Care   Other Self-Care Comments  body scan (recommended prior to bed) and fluid intake modifications to decrease frequency  recorded on pt's cell phone, guided pt to find recording app   Neuro Re-ed    Neuro Re-ed Details  pelvic floor relaxation   Exercises   Other Exercises  bridges with yellow band for hip abd   10 reps   Manual Therapy   Internal Pelvic Floor scar releases intrarectally  PT Education - 11/10/14 2330    Education provided Yes   Education Details HEP   Person(s) Educated Patient   Methods Explanation;Demonstration;Tactile cues;Verbal cues;Handout   Comprehension Verbalized understanding;Returned demonstration             PT Long Term Goals - 11/07/14 1130    PT LONG TERM GOAL #1   Title Pt will decrease her score on PFDI from 53.5% to less than 45% to demo improved pelvic floor function to perform ADLs.   Time 12   Period Weeks   Status On-going   PT LONG TERM GOAL #2   Title Pt will decrease her time with sit-to-stand from 32 sec to < 30 sec with UE support in order to access her toilet w/ decreased risk for falls.   Time 12   Period Weeks   Status On-going   PT LONG TERM GOAL #3   Title Pt will report compliance with toileting posture and bladder irritant to water ratio recommendations to improve urinary frequency/ constipation and decrease risk of  dehydration/hospitalization.    Time 12   Period Weeks   Status On-going   PT LONG TERM GOAL #4   Title Pt will demo increased gait speed from 0.64 m/s to >.70 m/s with use SPC in order to decrease risk for falls.    Time 12   Period Weeks   Status On-going               Plan - 11/10/14 2332    Clinical Impression Statement Pt demo'd increased rectal fascial mobility over scars from hemorroidectomy. Pt gained more awareness to correct pelvic floor coordination. Initiated hip strengthening from bridging position as pt did not tolerate sit-to-stand  mini squats .  Pt is progressing well towards her goals as she reports decreased difficulty with complete elimination. Addressed complaint of urinary frequency with fluid intake/ bladder irritant education.     Pt will benefit from skilled therapeutic intervention in order to improve on the following deficits Abnormal gait;Decreased activity tolerance;Decreased balance;Decreased mobility;Decreased strength;Postural dysfunction;Improper body mechanics;Decreased scar mobility;Hypomobility;Pain;Difficulty walking;Decreased coordination;Decreased range of motion;Decreased safety awareness;Decreased endurance;Increased fascial restricitons   Rehab Potential Good   Clinical Impairments Affecting Rehab Potential Pt's Hx of hysterectemy due to pelvic organ prolapse, post-polio syndrome, and colon resection surgery    PT Frequency 1x / week   PT Duration 12 weeks   PT Treatment/Interventions ADLs/Self Care Home Management;Cryotherapy;Biofeedback;Electrical Stimulation;Functional mobility training;Stair training;Gait training;Traction;Moist Heat;Therapeutic activities;Therapeutic exercise;Balance training;Neuromuscular re-education;Patient/family education;Passive range of motion;Scar mobilization;Manual techniques;Dry needling;Taping   PT Next Visit Plan assess glut strength, glut strengthening in supine or sidelying   Consulted and Agree with Plan of  Care Patient        Problem List There are no active problems to display for this patient.   Kristin Nunez ,PT, DPT, E-RYT  11/10/2014, 11:49 PM  [facility] MAIN [facility] SERVICES [address] [place], [place], [address] Phone: [phone]   Fax:  [phone]

## 2014-11-10 NOTE — Therapy (Deleted)
[facility] MAIN [facility] SERVICES [address] [place], [place], [address] Phone: [phone]   Fax:  [phone]  Physical Therapy Treatment  Patient Details  Name: Kristin Nunez MRN: [id number] Date of Birth: [birth year] Referring Provider:  Riki Altes, MD  Encounter Date: 11/09/2014      PT End of Session - 11/10/14 2331    Visit Number 6   Number of Visits 12   Date for PT Re-Evaluation 12/30/14   Authorization Type G-code 10th visit   PT Start Time 1415   PT Stop Time 1520   PT Time Calculation (min) 65 min   Activity Tolerance Patient tolerated treatment well;No increased pain   Behavior During Therapy [facility] for tasks assessed/performed      Past Medical History  Diagnosis Date  . Hypertension   . Tachycardia   . Atrial fibrillation   . Diverticula of colon   . 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 Colon resection    . Abdominal hysterectomy      prolapsed uterus   . Trigger thumb release       R  . Hip fracture surgery Right 2013    from a fall, also fx R shoulder: (plate/pins present in UE, pins LE)   . Eye surgery Left     cataract     There were no vitals filed for this visit.  Visit Diagnosis:  Weakness  Lack of coordination  Fascial defect          [facility] PT Assessment - 11/10/14 2312    Squat   Comments difficulty, downgraded to bridging                   Pelvic Floor Special Questions - 11/10/14 2310    Exam Type Rectal   Palpation digital insertion to pelvic floor mm less difficult today  improved pelvic floor lift    Biofeedback improved coordination w/ breathing  cuing once to correct paradoxical coordination           [facility] Adult PT Treatment/Exercise - 11/10/14 2322    Self-Care   Other Self-Care Comments  body scan (recommended prior to bed) and fluid intake modifications to decrease frequency   recorded on pt's cell phone, guided pt to find recording app   Neuro Re-ed    Neuro Re-ed Details  pelvic floor relaxation   Exercises   Other Exercises  bridges with yellow band for hip abd   10 reps   Manual Therapy   Internal Pelvic Floor scar releases intrarectally                PT Education - 11/10/14 2330    Education provided Yes   Education Details HEP   Person(s) Educated Patient   Methods Explanation;Demonstration;Tactile cues;Verbal cues;Handout   Comprehension Verbalized understanding;Returned demonstration             PT Long Term Goals - 11/07/14 1130    PT LONG TERM GOAL #1   Title Pt will decrease her score on PFDI from 53.5% to less than 45% to demo improved pelvic floor function to perform ADLs.   Time 12   Period Weeks   Status On-going   PT LONG TERM GOAL #2   Title Pt will decrease her time with sit-to-stand from 32 sec to < 30 sec with UE support in  order to access her toilet w/ decreased risk for falls.   Time 12   Period Weeks   Status On-going   PT LONG TERM GOAL #3   Title Pt will report compliance with toileting posture and bladder irritant to water ratio recommendations to improve urinary frequency/ constipation and decrease risk of dehydration/hospitalization.    Time 12   Period Weeks   Status On-going   PT LONG TERM GOAL #4   Title Pt will demo increased gait speed from 0.64 m/s to >.70 m/s with use SPC in order to decrease risk for falls.    Time 12   Period Weeks   Status On-going               Plan - 11/10/14 2332    Clinical Impression Statement Pt demo'd increased rectal fascial mobility over hemorroidectomy scars. Pt gained more awareness to correct pelvic floor coordination. Initiated hip strengthening from bridging position as pt did not tolerate sit-to-stand mini squats due to hip/LE weakness .  Pt is progressing well towards her goals as she reports decreased difficulty with complete elimination. Addressed  complaint of urinary frequency with fluid intake/ bladder irritant education.     Pt will benefit from skilled therapeutic intervention in order to improve on the following deficits Abnormal gait;Decreased activity tolerance;Decreased balance;Decreased mobility;Decreased strength;Postural dysfunction;Improper body mechanics;Decreased scar mobility;Hypomobility;Pain;Difficulty walking;Decreased coordination;Decreased range of motion;Decreased safety awareness;Decreased endurance;Increased fascial restricitons   Rehab Potential Good   Clinical Impairments Affecting Rehab Potential Pt's Hx of hysterectemy due to pelvic organ prolapse, post-polio syndrome, and colon resection surgery    PT Frequency 1x / week   PT Duration 12 weeks   PT Treatment/Interventions ADLs/Self Care Home Management;Cryotherapy;Biofeedback;Electrical Stimulation;Functional mobility training;Stair training;Gait training;Traction;Moist Heat;Therapeutic activities;Therapeutic exercise;Balance training;Neuromuscular re-education;Patient/family education;Passive range of motion;Scar mobilization;Manual techniques;Dry needling;Taping   PT Next Visit Plan assess glut strength, glut strengthening in supine or sidelying   Consulted and Agree with Plan of Care Patient        Problem List There are no active problems to display for this patient.   [name] 11/10/2014, 11:48 PM   [facility] MAIN [facility] SERVICES [address] [place], [place], [address] Phone: [phone]   Fax:  [phone]

## 2014-11-14 ENCOUNTER — Ambulatory Visit: Payer: Medicare Other | Admitting: Physical Therapy

## 2014-11-16 ENCOUNTER — Ambulatory Visit: Payer: Medicare Other | Admitting: Physical Therapy

## 2014-11-16 ENCOUNTER — Ambulatory Visit: Payer: Medicare Other

## 2014-11-16 ENCOUNTER — Encounter: Payer: Self-pay | Admitting: Emergency Medicine

## 2014-11-16 ENCOUNTER — Ambulatory Visit
Admission: EM | Admit: 2014-11-16 | Discharge: 2014-11-16 | Disposition: A | Discharge status: Home / Self Care | Payer: Medicare Other | Attending: Family Medicine | Admitting: Family Medicine

## 2014-11-16 DIAGNOSIS — Z882 Allergy status to sulfonamides status: Secondary | ICD-10-CM | POA: Diagnosis not present

## 2014-11-16 DIAGNOSIS — I10 Essential (primary) hypertension: Secondary | ICD-10-CM | POA: Diagnosis not present

## 2014-11-16 DIAGNOSIS — Z885 Allergy status to narcotic agent status: Secondary | ICD-10-CM | POA: Diagnosis not present

## 2014-11-16 DIAGNOSIS — M79675 Pain in left toe(s): Secondary | ICD-10-CM | POA: Diagnosis not present

## 2014-11-16 DIAGNOSIS — I4891 Unspecified atrial fibrillation: Secondary | ICD-10-CM | POA: Insufficient documentation

## 2014-11-16 NOTE — Discharge Instructions (Signed)
Apply ice. Elevate. Use post operative shoe as long as pain continues. Take home pain medication as needed.   Follow up with your primary care physician as needed for continued pain. Return to Urgent care for new or worsening concerns.

## 2014-11-16 NOTE — ED Provider Notes (Signed)
[facility] Emergency Department Provider Note  ____________________________________________  Time seen: Approximately 4:28 PM  I have reviewed the triage vital signs and the nursing notes.   HISTORY  Chief Complaint Toe Pain   HPI Kristin Nunez is a 74 y.o. female presents for the complaint of left great toe pain. Patient reports pain is been present 3-4 days. Patient does report that at the end of last week she had just finished mopping her floor and someone knocked the back door and she went to open the door. Patient states as she walked across the floor, one area had not yet dried and she slipped and landed on her legs. Patient states that she did partially catch her self and did not go all the way down. Denies head injury or loss of consciousness. Patient states that she was able to quickly get herself up and continued ambulating. Patient states that she does not know if she hit her foot at that time. Patient states that initially she felt sore but did not have any continued pain. Denies head injury or loss consciousness. Denies neck or back injury. Denies other fall or injury. Reports has continued to remain active.   Patient reports that pain is currently 3 out of 10 and states it is more of an aggravation than pain. Patient states that she knows that today it is slightly swollen. Denies any history of similar pain. Denies redness or break in skin. Denies other fall or injury. Denies numbness or tingling sensation. Denies pain radiation. Denies history of gout. Denies headache, weakness, dizziness, chest pain, shortness of breath, abdominal pain or other complaints.    Past Medical History  Diagnosis Date  . Hypertension   . Tachycardia   . Atrial fibrillation (HCC)   . Diverticula of colon   . 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   Past Surgical History  Procedure  Laterality Date  . Colon resection    . Abdominal hysterectomy      prolapsed uterus   . Trigger thumb release       R  . Hip fracture surgery Right 2013    from a fall, also fx R shoulder: (plate/pins present in UE, pins LE)   . Eye surgery Left     cataract     Current Outpatient Rx  Name  Route  Sig  Dispense  Refill  . pantoprazole (PROTONIX) 20 MG tablet   Oral   Take 20 mg by mouth daily.         Marland Kitchen. aspirin EC 81 MG tablet   Oral   Take 81 mg by mouth daily.         . diphenhydrAMINE (SOMINEX) 25 MG tablet   Oral   Take 25 mg by mouth at bedtime as needed for sleep.         . meloxicam (MOBIC) 15 MG tablet   Oral   Take 15 mg by mouth daily.         . metoprolol succinate (TOPROL-XL) 100 MG 24 hr tablet   Oral   Take 100 mg by mouth daily. Take with or immediately following a meal.         . nitrofurantoin, macrocrystal-monohydrate, (MACROBID) 100 MG capsule   Oral   Take 1 capsule (100 mg total) by mouth 2 (two) times daily. Patient not taking: Reported on  10/07/2014   14 capsule   0   . phenazopyridine (PYRIDIUM) 100 MG tablet   Oral   Take 1 tablet (100 mg total) by mouth 3 (three) times daily as needed for pain. Patient not taking: Reported on 10/07/2014   6 tablet   0     Allergies Ciprofloxacin; Demerol; Flagyl; Meprednisone; Morphine and related; Promethazine; Sulfa antibiotics; and Toradol  History reviewed. No pertinent family history.  Social History Social History  Substance Use Topics  . Smoking status: Never Smoker   . Smokeless tobacco: None  . Alcohol Use: No    Review of Systems Constitutional: No fever/chills Eyes: No visual changes. ENT: No sore throat. Cardiovascular: Denies chest pain. Respiratory: Denies shortness of breath. Gastrointestinal: No abdominal pain.  No nausea, no vomiting.  No diarrhea.  No constipation. Genitourinary: Negative for dysuria. Musculoskeletal: Negative for back pain. Left great toe pain.   Skin: Negative for rash. Neurological: Negative for headaches, focal weakness or numbness.  10-point ROS otherwise negative.  ____________________________________________   PHYSICAL EXAM:  VITAL SIGNS: ED Triage Vitals  Enc Vitals Group     BP 11/16/14 1529 142/70 mmHg     Pulse Rate 11/16/14 1529 79     Resp 11/16/14 1529 16     Temp 11/16/14 1529 97.4 F (36.3 C)     Temp Source 11/16/14 1529 Tympanic     SpO2 11/16/14 1529 99 %     Weight 11/16/14 1529 163 lb (73.936 kg)     Height 11/16/14 1529  (1.651 m)     Head Cir --      Peak Flow --      Pain Score 11/16/14 1532 4     Pain Loc --      Pain Edu? --      Excl. in GC? --     Constitutional: Alert and oriented. Well appearing and in no acute distress. Eyes: Conjunctivae are normal. PERRL. EOMI. Head: Atraumatic.  Nose: No congestion/rhinnorhea.  Mouth/Throat: Mucous membranes are moist.   Neck: No stridor.  No cervical spine tenderness to palpation. Cardiovascular: Normal rate, regular rhythm. Grossly normal heart sounds.  Good peripheral circulation. Respiratory: Normal respiratory effort.  No retractions. Lungs CTAB. Gastrointestinal: Soft and nontender. No distention. Normal Bowel sounds.  Musculoskeletal: No lower or upper extremity tenderness nor edema.  No joint effusions. Bilateral pedal pulses equal and easily palpated. No cervical, thoracic, or lumbar TTP.  Except: left great toe distal to MCP at proximal phalanx mild TTP, minimal swelling, no erythema, mild pain with flexion and extension, full ROM present, sensation and motor intact. Cap refill <2secs. No tenderness to light touch. Left foot otherwise nontender. Skin intact. No drainage, induration or fluctuance.  Neurologic:  Normal speech and language. No gross focal neurologic deficits are appreciated. No gait instability. Skin:  Skin is warm, dry and intact. No rash noted. Psychiatric: Mood and affect are normal. Speech and behavior are  normal.   RADIOLOGY  EXAM: LEFT GREAT TOE  COMPARISON: None.  FINDINGS: There is no evidence of fracture or dislocation. There is no evidence of arthropathy or other focal bone abnormality. Soft tissues are unremarkable.  IMPRESSION: Negative.   Electronically Signed By: Marlan Palau M.D. On: 11/16/2014 16:21  I, Renford Dills, personally viewed and evaluated these images (plain radiographs) as part of my medical decision making.   ____________________________________________   PROCEDURES  Procedure(s) performed:  Left 1/2 toes buddy taped by RN, neurovascular intact post application.  Post op  shoe applied by RN.   INITIAL IMPRESSION / ASSESSMENT AND PLAN / ED COURSE  Pertinent labs & imaging results that were available during my care of the patient were reviewed by me and considered in my medical decision making (see chart for details).  Very well appearing patient. No acute distress. Presents for left great toe pain. Does report recent possible injury post mechanical fall, but unsure if definite injury. Reports continues to ambulate well but with some pain. Denies head injury, LOC or other injury. Left great toe proximal phalanx mild TTP, no MCP or left foot pain, no erythema, nontender to light touch. No signs of infection.  Suspect contusion or sprain injury. Do not suspect gouty pain. Will evaluate xray.   Xray left great toe negative. Will treat supportively with buddy taping, post op shoe. Reports has walker at home if needed. Ice and elevation and continue to monitor. Reports has mobic and oxycodone at home as needed for pain. Denies need for pain medication. Discussed follow up with Primary care physician this week. Discussed follow up and return parameters including no resolution or any worsening concerns. Patient verbalized understanding and agreed to plan.   ____________________________________________   FINAL CLINICAL IMPRESSION(S) / ED  DIAGNOSES  Final diagnoses:  Pain of great toe, left       Renford Dills, NP 11/16/14 1840

## 2014-11-16 NOTE — ED Notes (Signed)
Patient c/o pain in her left 1st toe since Monday.

## 2014-11-21 ENCOUNTER — Ambulatory Visit: Payer: Medicare Other | Admitting: Physical Therapy

## 2014-11-21 DIAGNOSIS — R279 Unspecified lack of coordination: Secondary | ICD-10-CM

## 2014-11-21 DIAGNOSIS — R531 Weakness: Secondary | ICD-10-CM

## 2014-11-21 DIAGNOSIS — M629 Disorder of muscle, unspecified: Secondary | ICD-10-CM

## 2014-11-22 NOTE — Patient Instructions (Signed)
Return to practicing relaxation of pelvic floor w/ bowel movement without excessive effort  Practice Body scan

## 2014-11-22 NOTE — Therapy (Addendum)
[place] MAIN [facility] SERVICES [address] [place], [place], [address] Phone: [phone]   Fax:  [phone]  Physical Therapy Treatment  Patient Details  Name: Kristin Nunez MRN: [id number] Date of Birth: [birth year] No Data Recorded  Encounter Date: 11/21/2014      PT End of Session - 11/22/14 1528    Visit Number 7   Number of Visits 12   Date for PT Re-Evaluation 12/30/14   Authorization Type G-code 10th visit   Activity Tolerance Patient tolerated treatment well;No increased pain   Behavior During Therapy [facility] for tasks assessed/performed      Past Medical History  Diagnosis Date  . Hypertension   . Tachycardia   . Atrial fibrillation ([place])   . Diverticula of colon   . 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 Colon resection    . Abdominal hysterectomy      prolapsed uterus   . Trigger thumb release       R  . Hip fracture surgery Right 2013    from a fall, also fx R shoulder: (plate/pins present in UE, pins LE)   . Eye surgery Left     cataract     There were no vitals filed for this visit.  Visit Diagnosis:  Lack of coordination  Weakness  Fascial defect      Subjective Assessment - 11/22/14 1518    Subjective  Pt reported 40-50% improvement with bowel movements and has noticed decreased hemorrhoids. Pt has tried the coffee alternative, decreased her night time tea but she is still experiencing frequent nocturia.   Pt reported she skipped last week due to spraining her L toe after a fall where she was able to catch herself. Pt rpeorted x-rays showed no Fx.   Pertinent History Hx of colon resection (to treat diverticulitis) and has had only one attack post-surgery, Hx of complete hysterectemy, post-polio, R hip surgery from Fx,.  Current routine: 45-90 min swimming 3x/wk    Patient Stated Goals increase mm strength / control to make it to the  bathroom                       Pelvic Floor Special Questions - 11/22/14 1521    Exam Type Rectal   Palpation only hyperactivity noted at IAS, pelvic floor . After cuing for decreased effort on contractions, pt demo' d properly.    Biofeedback diaphragmatic/ pelvic floor breathin proper and without cuing f           [facility] Adult PT Treatment/Exercise - 11/22/14 1526    Neuro Re-ed    Neuro Re-ed Details  cuing for decreased effort     Modalities   Modalities --  ice over rectum (no charge) 10 min   Manual Therapy   Internal Pelvic Floor scar releases intrarectally                PT Education - 11/22/14 1528    Education provided Yes   Education Details HEP   Person(s) Educated Patient   Methods Explanation;Demonstration;Tactile cues;Verbal cues   Comprehension Verbalized understanding;Returned demonstration             PT Long Term Goals - 11/22/14 1535    PT LONG TERM GOAL #1   Title Pt will decrease her score on PFDI from 53.5% to less than  45% to demo improved pelvic floor function to perform ADLs.   Time 12   Period Weeks   Status On-going   PT LONG TERM GOAL #2   Title Pt will decrease her time with sit-to-stand from 32 sec to < 30 sec with UE support in order to access her toilet w/ decreased risk for falls.   Time 12   Period Weeks   Status On-going   PT LONG TERM GOAL #3   Title Pt will report compliance with toileting posture and bladder irritant to water ratio recommendations to improve urinary frequency/ constipation and decrease risk of dehydration/hospitalization.    Time 12   Period Weeks   Status Partially Met   PT LONG TERM GOAL #4   Title Pt will demo increased gait speed from 0.64 m/s to >.70 m/s with use SPC in order to decrease risk for falls.    Time 12   Period Weeks   Status On-going               Plan - 11/22/14 1529    Clinical Impression Statement Pt is progressing well with increased control over  pelvic floor muscles but required minor cuing to apply decreased effort on exhalation/ pelvic lift. Pt reported increased tenderness w/ moderate/lightpressure along puborectalis (anterior) at which PT stopped intrarectal  manual Tx. Pt continues to require skilled PT to address her goals. Will initiate deep core strengthnin   Pt will benefit from skilled therapeutic intervention in order to improve on the following deficits Abnormal gait;Decreased activity tolerance;Decreased balance;Decreased mobility;Decreased strength;Postural dysfunction;Improper body mechanics;Decreased scar mobility;Hypomobility;Pain;Difficulty walking;Decreased coordination;Decreased range of motion;Decreased safety awareness;Decreased endurance;Increased fascial restricitons;Impaired tone   Rehab Potential Good   Clinical Impairments Affecting Rehab Potential Pt's Hx of hysterectemy due to pelvic organ prolapse, post-polio syndrome, and colon resection surgery    PT Frequency 1x / week   PT Duration 12 weeks   PT Treatment/Interventions ADLs/Self Care Home Management;Cryotherapy;Biofeedback;Electrical Stimulation;Functional mobility training;Stair training;Gait training;Traction;Moist Heat;Therapeutic activities;Therapeutic exercise;Balance training;Neuromuscular re-education;Patient/family education;Passive range of motion;Scar mobilization;Manual techniques;Dry needling;Taping   PT Next Visit Plan assess glut strength, glut strengthening in supine or sidelying   Consulted and Agree with Plan of Care Patient        Problem List There are no active problems to display for this patient.   Jerl Mina  ,PT, DPT, E-RYT  11/22/2014, 3:37 PM  [place] MAIN [facility] SERVICES [address] [place], [place], [address] Phone: [phone]   Fax:  [phone]  Name: Kristin Nunez MRN: [id number] Date of Birth: 1940/07/01

## 2014-11-23 ENCOUNTER — Ambulatory Visit: Payer: Medicare Other | Admitting: Physical Therapy

## 2014-11-23 DIAGNOSIS — R531 Weakness: Secondary | ICD-10-CM

## 2014-11-23 DIAGNOSIS — M629 Disorder of muscle, unspecified: Secondary | ICD-10-CM

## 2014-11-23 DIAGNOSIS — R279 Unspecified lack of coordination: Secondary | ICD-10-CM

## 2014-11-24 NOTE — Therapy (Signed)
[place] MAIN [facility] SERVICES [address] [place], [place], [address] Phone: [phone]   Fax:  [phone]  Physical Therapy Treatment  Patient Details  Name: Kristin Nunez MRN: [id number] Date of Birth: 1940/02/26 Referring Provider: Elenor Quinones  Encounter Date: 11/23/2014      PT End of Session - 11/24/14 1027    Visit Number 8   Number of Visits 12   Date for PT Re-Evaluation 12/30/14   Authorization Type G-code 10th visit   PT Start Time 1130   PT Stop Time 1230   PT Time Calculation (min) 60 min   Activity Tolerance Patient tolerated treatment well;No increased pain   Behavior During Therapy [facility] for tasks assessed/performed      Past Medical History  Diagnosis Date  . Hypertension   . Tachycardia   . Atrial fibrillation ([place])   . Diverticula of colon   . 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 Colon resection    . Abdominal hysterectomy      prolapsed uterus   . Trigger thumb release       R  . Hip fracture surgery Right 2013    from a fall, also fx R shoulder: (plate/pins present in UE, pins LE)   . Eye surgery Left     cataract     There were no vitals filed for this visit.  Visit Diagnosis:  Lack of coordination  Weakness  Fascial defect      Subjective Assessment - 11/23/14 1135    Subjective Pt has returned to her HEP, tried half coffee alternative and coffee, and has been more consistent with her hip and LE exercsies that she has had done before with prior PT.       Pertinent History Hx of colon resection (to treat diverticulitis) and has had only one attack post-surgery, Hx of complete hysterectemy, post-polio, R hip surgery from Fx,.  Current routine: 45-90 min swimming 3x/wk    Patient Stated Goals increase mm strength / control to make it to the bathroom             [facility] PT Assessment - 11/24/14 1019    Assessment   Medical Diagnosis Urge Incontinence   Referring Provider Stoiff   Precautions   Precautions None   Restrictions   Weight Bearing Restrictions No   Balance Screen   Has the patient fallen in the past 6 months Yes   How many times? 1   Has the patient had a decrease in activity level because of a fear of falling?  No   Is the patient reluctant to leave their home because of a fear of falling?  No   Posture/Postural Control   Posture Comments coordinating deep core system with less cuing                  Pelvic Floor Special Questions - 11/24/14 1024    Pelvic Floor Internal Exam pt verbally consented without contraindications    Exam Type Vaginal   Palpation bilaterally  obt int/ iliococcygeus tenderness/ tightness    Biofeedback improved pelvic floor ROM post-Tx with decreased tenderness/tensions  cued for no increased tightening in rectum with exhalation           [facility] Adult PT Treatment/Exercise - 11/24/14 1019    Self-Care   Other Self-Care Comments  emphasized continuation of body  scan practice prior to bed consistently   provided community classes information ([employer])    Neuro Re-ed    Neuro Re-ed Details  dynamic stabilization  1  10 reps x 2 (minor cuing)    Exercises   Other Exercises  NU-Step with cues for exhalation with UE, followed by pushing 6 min (reported soreness on L distal thighat 6 min). Level 0. Report of no pain after stretches (piriformis 5 breaths) .   pillow behind back for more upright posture                PT Education - 11/24/14 1027    Education provided Yes   Education Details HEP   Person(s) Educated Patient   Methods Explanation;Demonstration;Tactile cues;Verbal cues;Handout   Comprehension Verbalized understanding;Returned demonstration             PT Long Term Goals - 11/22/14 1535    PT LONG TERM GOAL #1   Title Pt will decrease her score on PFDI from 53.5% to less than 45% to demo improved pelvic  floor function to perform ADLs.   Time 12   Period Weeks   Status On-going   PT LONG TERM GOAL #2   Title Pt will decrease her time with sit-to-stand from 32 sec to < 30 sec with UE support in order to access her toilet w/ decreased risk for falls.   Time 12   Period Weeks   Status On-going   PT LONG TERM GOAL #3   Title Pt will report compliance with toileting posture and bladder irritant to water ratio recommendations to improve urinary frequency/ constipation and decrease risk of dehydration/hospitalization.    Time 12   Period Weeks   Status Partially Met   PT LONG TERM GOAL #4   Title Pt will demo increased gait speed from 0.64 m/s to >.70 m/s with use SPC in order to decrease risk for falls.    Time 12   Period Weeks   Status On-going               Plan - 11/24/14 1028    Clinical Impression Statement Pt demo'd decreased tensions and tenderness in pelvic floor mm after intravaginal Tx. Pt was able to demo proper ROM of pelvic floor.  Initiated Nu-Step for general conditioning and educated pt on joining [employer] class once a week to maintain overall strength. Pt was also educated on the importance of short bouts of exercises to avoid mm fatigue 2/2 to her neurological condition with polio and surgery Hx in R hips. Pt tolerated Nu-Step without resistance for 6 min before expressing soreness in L thigh. Pt reported no pain post-exercise hip stretch .   Pt is progressing well towards her goals.    Pt will benefit from skilled therapeutic intervention in order to improve on the following deficits Abnormal gait;Decreased activity tolerance;Decreased balance;Decreased mobility;Decreased strength;Postural dysfunction;Improper body mechanics;Decreased scar mobility;Hypomobility;Pain;Difficulty walking;Decreased coordination;Decreased range of motion;Decreased safety awareness;Decreased endurance;Increased fascial restricitons;Impaired tone   Rehab Potential Good   Clinical  Impairments Affecting Rehab Potential Pt's Hx of hysterectemy due to pelvic organ prolapse, post-polio syndrome, and colon resection surgery    PT Frequency 1x / week   PT Duration 12 weeks   PT Treatment/Interventions ADLs/Self Care Home Management;Cryotherapy;Biofeedback;Electrical Stimulation;Functional mobility training;Stair training;Gait training;Traction;Moist Heat;Therapeutic activities;Therapeutic exercise;Balance training;Neuromuscular re-education;Patient/family education;Passive range of motion;Scar mobilization;Manual techniques;Dry needling;Taping   PT Next Visit Plan assess glut strength, glut strengthening in supine or sidelying   Consulted and Agree with  Plan of Care Patient        Problem List There are no active problems to display for this patient.   Jerl Mina  ,PT, DPT, E-RYT  11/24/2014, 10:37 AM  [place] MAIN [facility] SERVICES [address] [place], [place], [address] Phone: [phone]   Fax:  [phone]  Name: Kristin Nunez MRN: [id number] Date of Birth: [birth year]

## 2014-11-24 NOTE — Patient Instructions (Signed)
Level 1  You are now ready to begin training the deep core muscles system: diaphragm, transverse abdominis, pelvic floor . These muscles must work together as a team.           The key to these exercises to train the brain to coordinate the timing of these muscles and to have them turn on for long periods of time to hold you upright against gravity (especially important if you are on your feet all day).These muscles are postural muscles and play a role stabilizing your spine and bodyweight. By doing these repetitions slowly and correctly instead of doing crunches, you will achieve a flatter belly without a lower pooch. You are also placing your spine in a more neutral position and breathing properly which in turn, decreases your risk for problems related to your pelvic floor, abdominal, and low back such as pelvic organ prolapse, hernias, diastasis recti (separation of superficial muscles), disk herniations, spinal fractures. These exercises set a solid foundation for you to later progress to resistance/ strength training with therabands and weights and return to other typical fitness exercises with a stronger deeper core.

## 2014-11-28 ENCOUNTER — Ambulatory Visit: Payer: Medicare Other | Admitting: Physical Therapy

## 2014-11-28 DIAGNOSIS — R531 Weakness: Secondary | ICD-10-CM

## 2014-11-28 DIAGNOSIS — M629 Disorder of muscle, unspecified: Secondary | ICD-10-CM

## 2014-11-28 DIAGNOSIS — R279 Unspecified lack of coordination: Secondary | ICD-10-CM

## 2014-11-28 NOTE — Patient Instructions (Signed)
Sideways walking down hallway 3 laps (L/R)    Piriformis stretch and hip flexor stretch 5 breaths each     Level 1-2  You are now ready to begin training the deep core muscles system: diaphragm, transverse abdominis, pelvic floor . These muscles must work together as a team.           The key to these exercises to train the brain to coordinate the timing of these muscles and to have them turn on for long periods of time to hold you upright against gravity (especially important if you are on your feet all day).These muscles are postural muscles and play a role stabilizing your spine and bodyweight. By doing these repetitions slowly and correctly instead of doing crunches, you will achieve a flatter belly without a lower pooch. You are also placing your spine in a more neutral position and breathing properly which in turn, decreases your risk for problems related to your pelvic floor, abdominal, and low back such as pelvic organ prolapse, hernias, diastasis recti (separation of superficial muscles), disk herniations, spinal fractures. These exercises set a solid foundation for you to later progress to resistance/ strength training with therabands and weights and return to other typical fitness exercises with a stronger deeper core.

## 2014-11-29 NOTE — Therapy (Signed)
[place] [facility] [address] , [place], [id number] Phone: [phone]   Fax:  [phone]  Physical Therapy Treatment  Patient Details  Name: Kristin Nunez MRN: [id number] Date of Birth: [birth year] Referring Provider: Virl DiamondStoiff  Encounter Date: 11/28/2014      PT End of Session - 11/29/14 2053    Visit Number 9   Number of Visits 12   Date for PT Re-Evaluation 12/30/14   Authorization Type G-code 10th visit   PT Start Time 1100   PT Stop Time 1200   PT Time Calculation (min) 60 min   Activity Tolerance Patient tolerated treatment well;No increased pain   Behavior [facility] for tasks assessed/performed      Past Medical History  Diagnosis Date  . Hypertension   . Tachycardia   . Atrial fibrillation ([facility])   . Diverticula of colon   . 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 Colon resection    . Abdominal hysterectomy      prolapsed uterus   . Trigger thumb release       R  . Hip fracture surgery Right 2013    from a fall, also fx R shoulder: (plate/pins present in UE, pins LE)   . Eye surgery Left     cataract     There were no vitals filed for this visit.  Visit Diagnosis:  Lack of coordination  Fascial defect  Weakness      Subjective Assessment - 11/29/14 2005    Subjective Pt reported her bowel movements are easier. Pt reports she has resumed the previous PT exercises presecribed for her hip and she finds that she is walking better after doing those exercises.   Pertinent History Hx of colon resection (to treat diverticulitis) and has had only one attack post-surgery, Hx of complete hysterectemy, post-polio, R hip surgery from Fx,.  Current routine: 45-90 min swimming 3x/wk    Patient Stated Goals increase mm strength / control to make it to the bathroom                       Pelvic Floor Special  Questions - 11/29/14 2006    Pelvic Floor Internal Exam pt verbally consented without contraindications    Exam Type --  rectal and vaginal   Palpation rectal: noted significantly less constricted anal canal, pt demo'd more relaxation of external sphincter and pelvic floor mm, decreased scar restrictions, tenderness over minor area over scar (decreased post-Tx). vaginally: proper coordination, withheld pelvic floor strength training to minimize relapse of hyperactivity of posterior mm , no tensions noted thru vaginal assessment     Biofeedback pt demo'd she is ready for dynamic stabilization 1-2           [facility] Adult PT Treatment/Exercise - 11/29/14 2011    Neuro Re-ed    Neuro Re-ed Details  see pt instructions   Exercises   Other Exercises  NU-Step 7 min Level 0, hip flexor and piriformis stretch bilaterally 5 breaths    pillow behind back for more upright posture   Manual Therapy   Internal Pelvic Floor scar release light pressure rectally ventral surface above internal anal sphincter                PT Education - 11/29/14 2053    Education provided Yes  Education Details HEP, discussed community classes such as [employer], fashion/clothing) Educated Patient   Methods Explanation;Demonstration;Tactile cues;Verbal cues;Handout   Comprehension Verbalized understanding;Returned demonstration             PT Long Term Goals - 11/29/14 2049    PT LONG TERM GOAL #1   Title Pt will decrease her score on PFDI from 53.5% to less than 45% to demo improved pelvic floor function to perform ADLs.   Time 12   Period Weeks   Status On-going   PT LONG TERM GOAL #2   Title Pt will decrease her time with sit-to-stand from 32 sec to < 30 sec with UE support in order to access her toilet w/ decreased risk for falls.   Time 12   Period Weeks   Status On-going   PT LONG TERM GOAL #3   Title Pt will report compliance with toileting posture and bladder irritant to water ratio  recommendations to improve urinary frequency/ constipation and decrease risk of dehydration/hospitalization.    Time 12   Period Weeks   Status Achieved   PT LONG TERM GOAL #4   Title Pt will demo increased gait speed from 0.64 m/s to >.70 m/s with use SPC in order to decrease risk for falls.    Time 12   Period Weeks   Status On-going   PT LONG TERM GOAL #5   Title Pt demo dynamic stabilization exercises Level 1-4 10 reps without cuing and signs of lumbopelvic instability in order progress to dynamic stability exercises.    Time 12   Period Weeks   Status New               Plan - 11/29/14 2042    Clinical Impression Statement Pt is progressing well with signs of significantly more relaxed pelvic floor muscles. Pt advanced to deep core stabilization exercises but pelvic floor strengthening program is being withheld due to avoid relapse of hyperactivity of pelvic floor muscles. Gradually initiating functional exercises to help pt maintain overall strength as pt reports she no longer doing her regular swimming routine after the pool closed.     Pt will benefit from skilled therapeutic intervention in order to improve on the following deficits Abnormal gait;Decreased activity tolerance;Decreased balance;Decreased mobility;Decreased strength;Postural dysfunction;Improper body mechanics;Decreased scar mobility;Hypomobility;Pain;Difficulty walking;Decreased coordination;Decreased range of motion;Decreased safety awareness;Decreased endurance;Increased fascial restricitons;Impaired tone   Rehab Potential Good   Clinical Impairments Affecting Rehab Potential Pt's Hx of hysterectemy due to pelvic organ prolapse, post-polio syndrome, and colon resection surgery    PT Frequency 1x / week   PT Duration 12 weeks   PT Treatment/Interventions ADLs/Self Care Home Management;Cryotherapy;Biofeedback;Electrical Stimulation;Functional mobility training;Stair training;Gait training;Traction;Moist  Heat;Therapeutic activities;Therapeutic exercise;Balance training;Neuromuscular re-education;Patient/family education;Passive range of motion;Scar mobilization;Manual techniques;Dry needling;Taping   PT Next Visit Plan assess glut strength, glut strengthening in supine or sidelying   Consulted and Agree with Plan of Care Patient        Problem List There are no active problems to display for this patient.   [name] ,PT, DPT, E-RYT 11/29/2014, 8:56 PM  [place] [facility] MAIN [facility] SERVICES [address] [place], [place], [address] Phone: [phone]   Fax:  [phone]  Name: Kristin Nunez MRN: [id number] Date of Birth: [birth year]

## 2014-11-30 ENCOUNTER — Ambulatory Visit: Payer: Medicare Other | Admitting: Physical Therapy

## 2014-11-30 DIAGNOSIS — R279 Unspecified lack of coordination: Secondary | ICD-10-CM

## 2014-11-30 DIAGNOSIS — R531 Weakness: Secondary | ICD-10-CM

## 2014-11-30 DIAGNOSIS — M629 Disorder of muscle, unspecified: Secondary | ICD-10-CM

## 2014-12-01 NOTE — Therapy (Signed)
[place] [facility]IN [facility] [address] [place], [place], [address] Phone: [phone]   Fax:  [phone]  Physical Therapy Treatment  Patient Details  Name: Kristin FettersJudy J Nunez MRN: [id number] Date of Birth: Apr 21, 1940 Referring Provider: Virl DiamondStoiff  Encounter Date: 11/30/2014      PT End of Session - 12/01/14 1248    Visit Number 10   Number of Visits 12   Date for PT Re-Evaluation 12/30/14   Authorization Type G-code 10th visit   PT Start Time 1117   PT Stop Time 1215   PT Time Calculation (min) 58 min   Activity Tolerance Patient tolerated treatment well;No increased pain   Behavior During Therapy [facility] for tasks assessed/performed      Past Medical History  Diagnosis Date  . Hypertension   . Tachycardia   . Atrial fibrillation (HCC)   . Diverticula of colon   . 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 Colon resection    . Abdominal hysterectomy      prolapsed uterus   . Trigger thumb release       R  . Hip fracture surgery Right 2013    from a fall, also fx R shoulder: (plate/pins present in UE, pins LE)   . Eye surgery Left     cataract     There were no vitals filed for this visit.  Visit Diagnosis:  Lack of coordination  Fascial defect  Weakness      Subjective Assessment - 12/01/14 1240    Subjective Pt reported her bowel movements are easier. Pt reports she has resumed the previous PT exercises presecribed for her hip and she finds that she is walking better after doing those exercises.   Pertinent History Hx of colon resection (to treat diverticulitis) and has had only one attack post-surgery, Hx of complete hysterectemy, post-polio, R hip surgery from Fx,.  Current routine: 45-90 min swimming 3x/wk    Patient Stated Goals increase mm strength / control to make it to the bathroom             [facility] - 12/01/14 1240     Assessment   Medical Diagnosis Urge Incontinence   [facility]erring Provider [facility]   Coordination   Gross Motor Movements are Fluid and Coordinated --  displayed bulging at the end exhalation                  Pelvic Floor Special Questions - 12/01/14 1243    Pelvic Floor Internal Exam pt verbally consented without contraindications    Exam Type Rectal   Palpation Pre-Tx: constriction at pelvic floor mm, significantly decreased scar mobility and tenderness, Post-Tx: relaxation of pelvic floor mm with less constricted anal canal    Biofeedback verbal and tactile cuing           [facility] Adult PT Treatment/Exercise - 12/01/14 1242    Neuro Re-ed    Neuro Re-ed Details  excessive tactile and verbal cuing for lifting during exhalation instead bulging                 PT Education - 12/01/14 1248    Education provided Yes   Education Details HEP   Person(s) Educated Patient   Methods Explanation;Demonstration;Tactile cues;Verbal cues;Handout   Comprehension Verbalized understanding;Returned demonstration             PT  Long Term Goals - December 03, 2014 1249    PT LONG TERM GOAL #1   Title Pt will decrease her score on PFDI from 53.5% to less than 45% to demo improved pelvic floor function to perform ADLs.   Time 12   Period Weeks   Status On-going   PT LONG TERM GOAL #2   Title Pt will decrease her time with sit-to-stand from 32 sec to < 30 sec with UE support in order to access her toilet w/ decreased risk for falls.   Time 12   Period Weeks   Status On-going   PT LONG TERM GOAL #3   Title Pt will report compliance with toileting posture and bladder irritant to water ratio recommendations to improve urinary frequency/ constipation and decrease risk of dehydration/hospitalization.    Time 12   Period Weeks   Status Achieved   PT LONG TERM GOAL #4   Title Pt will demo increased gait speed from 0.64 m/s to >.70 m/s with use SPC in order to decrease risk for falls.     Time 12   Period Weeks   Status On-going   PT LONG TERM GOAL #5   Title Pt demo dynamic stabilization exercises Level 1-4 10 reps without cuing and signs of lumbopelvic instability in order progress to dynamic stability exercises.    Time 12   Period Weeks   Status On-going               Plan - 2014-12-03 1249    Clinical Impression Statement Pt has achieved 1/5 goals and is progressing well towards her remaining goals.  Although pt reported less difficulty with bowel elimination, pt continues to require motor control training to coordinate pelvic floor properly. Pt showed increased scar mobility in rectum from hemorrhoidectomy,  improved deep core coordination, and compliance to bladder health modifications. Pt also showed improved gait since the start of SOC with less dependence on SPC.  Pt will continue to require skilled PT.     Pt will benefit from skilled therapeutic intervention in order to improve on the following deficits Abnormal gait;Decreased activity tolerance;Decreased balance;Decreased mobility;Decreased strength;Improper body mechanics;Decreased scar mobility;Hypomobility;Pain;Difficulty walking;Decreased coordination;Decreased range of motion;Decreased safety awareness;Decreased endurance;Increased fascial restricitons;Impaired tone   Rehab Potential Good   Clinical Impairments Affecting Rehab Potential Pt's Hx of hysterectemy due to pelvic organ prolapse, post-polio syndrome, and colon resection surgery    PT Frequency 1x / week   PT Duration 12 weeks   PT Treatment/Interventions ADLs/Self Care Home Management;Cryotherapy;Biofeedback;Electrical Stimulation;Functional mobility training;Stair training;Gait training;Traction;Moist Heat;Therapeutic activities;Therapeutic exercise;Balance training;Neuromuscular re-education;Patient/family education;Passive range of motion;Scar mobilization;Manual techniques;Dry needling;Taping   Consulted and Agree with Plan of Care Patient           G-Codes - 12/03/14 1254    Functional Assessment Tool Used clinical judgement    Functional Limitation Self care   Self Care Current Status (Z6109) At least 20 percent but less than 40 percent impaired, limited or restricted   Self Care Goal Status (U0454) At least 20 percent but less than 40 percent impaired, limited or restricted      Problem List There are no active problems to display for this patient.   [name] ,PT, DPT, E-RYT  Dec 03, 2014, 12:58 PM  [place] [facility] [address] MAIN [facility] SERVICES [address] [place], [place], [address] Phone: [phone]   Fax:  [phone]  Name: Kristin Nunez MRN: [id number] Date of Birth: 08/02/1940

## 2014-12-05 ENCOUNTER — Ambulatory Visit: Payer: Medicare Other | Admitting: Physical Therapy

## 2014-12-05 DIAGNOSIS — R279 Unspecified lack of coordination: Secondary | ICD-10-CM

## 2014-12-05 DIAGNOSIS — R531 Weakness: Secondary | ICD-10-CM

## 2014-12-05 DIAGNOSIS — M629 Disorder of muscle, unspecified: Secondary | ICD-10-CM

## 2014-12-05 NOTE — Patient Instructions (Addendum)
  Clam Shell 45 Degrees      Lying with hips and knees bent 45, one pillow between knees and ankles. Lift knee with exhale. Be sure pelvis does not roll backward. Do not arch back. Do 10 times, each leg, 2 times per day.  http://ss.exer.[email or web address]    ________________  Kristin AhrFill out dietary diary:  Write down the time of meals, time when you feel the urge for bowel movement, time for elimination   Start the day with luke warm water and drops of lemon before drinking coffee.  ________________  Belly massage and breathing practice with elimination! Good job!!!!   ________________  Side step hallway 3x  and sidestep front steps using hand railings 3x   Copyright  VHI. All rights reserved.

## 2014-12-05 NOTE — Therapy (Signed)
[place] [facility] MAIN [facility] SERVICES [address] [place], [place], [address] Phone: [phone]   Fax:  [phone]  Physical Therapy Treatment  Patient Details  Name: Kristin Nunez MRN: [id number] Date of Birth: [birth year] Referring Provider: Virl Diamond  Encounter Date: 12/05/2014      PT End of Session - 12/05/14 1926    Visit Number 11   Number of Visits 12   Date for PT Re-Evaluation 12/30/14   Authorization Type G-code 10th visit (1/10)    PT Start Time 1108   PT Stop Time 1210   PT Time Calculation (min) 62 min      Past Medical History  Diagnosis Date  . Hypertension   . Tachycardia   . Atrial fibrillation (HCC)   . Diverticula of colon   . 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 Colon resection    . Abdominal hysterectomy      prolapsed uterus   . Trigger thumb release       R  . Hip fracture surgery Right 2013    from a fall, also fx R shoulder: (plate/pins present in UE, pins LE)   . Eye surgery Left     cataract     There were no vitals filed for this visit.  Visit Diagnosis:  Lack of coordination  Fascial defect  Weakness      Subjective Assessment - 12/05/14 1902    Subjective Pt reports she is able to tell when she is coordinating her pelvic floor properly now. Pt report decreased sensation at the anus as she finds fecal leakage occasionally when she cleans herself after urination. Pt was wondering if her colon resection surgery has impacted her poor sensation.  Pt also wonders if she is taking too much Miralax.     Pertinent History Hx of colon resection (to treat diverticulitis) and has had only one attack post-surgery, Hx of complete hysterectemy, post-polio, R hip surgery from Fx,.  Current routine: 45-90 min swimming 3x/wk    Patient Stated Goals increase mm strength / control to make it to the bathroom             [facility] PT  Assessment - 12/05/14 1909    Assessment   Medical Diagnosis (p) Urge Incontinence   Referring Provider (p) Stoiff   Strength   Overall Strength Comments (p) hip abd 3/5 R with pain, L 4/5                   Pelvic Floor Special Questions - 12/05/14 1907    Pelvic Floor Internal Exam pt verbally consented without contraindications    Exam Type Rectal   Palpation significant improvement with motor control and easier digital insertion compared to last week           [facility] Adult PT Treatment/Exercise - 12/05/14 1909    Ambulation/Gait   Stairs --  side stepping UE support over 6 steps no difficulty   Self-Care   Other Self-Care Comments  education on completing food diary with indication of sensation to eliminate bowels, completion y/n    Exercises   Other Exercises  clam shells downgraded from red (pain at R knee), to yellow ( pain) to no band                 PT Education - 12/05/14 1926    Education  provided Yes   Education Details HEP   Person(s) Educated Patient   Methods Explanation;Demonstration;Tactile cues;Verbal cues;Handout   Comprehension Verbalized understanding;Returned demonstration;Verbal cues required;Tactile cues required             PT Long Term Goals - 12/01/14 1249    PT LONG TERM GOAL #1   Title Pt will decrease her score on PFDI from 53.5% to less than 45% to demo improved pelvic floor function to perform ADLs.   Time 12   Period Weeks   Status On-going   PT LONG TERM GOAL #2   Title Pt will decrease her time with sit-to-stand from 32 sec to < 30 sec with UE support in order to access her toilet w/ decreased risk for falls.   Time 12   Period Weeks   Status On-going   PT LONG TERM GOAL #3   Title Pt will report compliance with toileting posture and bladder irritant to water ratio recommendations to improve urinary frequency/ constipation and decrease risk of dehydration/hospitalization.    Time 12   Period Weeks   Status  Achieved   PT LONG TERM GOAL #4   Title Pt will demo increased gait speed from 0.64 m/s to >.70 m/s with use SPC in order to decrease risk for falls.    Time 12   Period Weeks   Status On-going   PT LONG TERM GOAL #5   Title Pt demo dynamic stabilization exercises Level 1-4 10 reps without cuing and signs of lumbopelvic instability in order progress to dynamic stability exercises.    Time 12   Period Weeks   Status On-going               Plan - 12/05/14 1927    Clinical Impression Statement Pt showed significantly improved motor control with rectal mm relaxation and contraction without bulging down during exhalation. Pt was advised to complete a food diary and indication of her urge to eliminate bowel and completion of bowel movement. Anticipate this data will help evaluate fiber intake and/or  train pt to be on schedule with bowel elimination if her sensory deficits do not improve.  Pt required downgrade of clam shells due to knee pain (withheld resistive bands).  Pt will continue to require modifications to her strengthening program to accommodate her hip /knee pain.    Pt will benefit from skilled therapeutic intervention in order to improve on the following deficits Abnormal gait;Decreased activity tolerance;Decreased balance;Decreased mobility;Decreased strength;Improper body mechanics;Decreased scar mobility;Hypomobility;Pain;Difficulty walking;Decreased coordination;Decreased range of motion;Decreased safety awareness;Decreased endurance;Increased fascial restricitons;Impaired tone   Rehab Potential Good   Clinical Impairments Affecting Rehab Potential Pt's Hx of hysterectemy due to pelvic organ prolapse, post-polio syndrome, and colon resection surgery    PT Frequency 1x / week   PT Duration 12 weeks   PT Treatment/Interventions ADLs/Self Care Home Management;Cryotherapy;Biofeedback;Electrical Stimulation;Functional mobility training;Stair training;Gait training;Traction;Moist  Heat;Therapeutic activities;Therapeutic exercise;Balance training;Neuromuscular re-education;Patient/family education;Passive range of motion;Scar mobilization;Manual techniques;Dry needling;Taping   Consulted and Agree with Plan of Care Patient        Problem List There are no active problems to display for this patient.   Mariane MastersYeung,Shin Yiing  ,PT, DPT, E-RYT  12/05/2014, 7:32 PM  [place] [facility] MAIN [place] [facility] [address] , [place], [address] Phone: [phone]   Fax:  [phone]  Name: Beatrix FettersJudy J Schremp MRN: [id number] Date of Birth: 12-23-1940

## 2014-12-07 ENCOUNTER — Other Ambulatory Visit: Payer: Self-pay

## 2014-12-07 ENCOUNTER — Encounter: Payer: Self-pay | Admitting: Physical Therapy

## 2014-12-07 ENCOUNTER — Emergency Department: Payer: Medicare Other

## 2014-12-07 ENCOUNTER — Encounter: Payer: Self-pay | Admitting: *Deleted

## 2014-12-07 ENCOUNTER — Ambulatory Visit: Payer: Medicare Other | Attending: Urology | Admitting: Physical Therapy

## 2014-12-07 ENCOUNTER — Emergency Department
Admission: EM | Admit: 2014-12-07 | Discharge: 2014-12-07 | Disposition: A | Discharge status: Home / Self Care | Payer: Medicare Other | Attending: Emergency Medicine | Admitting: Emergency Medicine

## 2014-12-07 VITALS — BP 170/90

## 2014-12-07 DIAGNOSIS — R51 Headache: Secondary | ICD-10-CM | POA: Diagnosis not present

## 2014-12-07 DIAGNOSIS — Z79899 Other long term (current) drug therapy: Secondary | ICD-10-CM | POA: Insufficient documentation

## 2014-12-07 DIAGNOSIS — I10 Essential (primary) hypertension: Secondary | ICD-10-CM | POA: Diagnosis not present

## 2014-12-07 DIAGNOSIS — Z791 Long term (current) use of non-steroidal anti-inflammatories (NSAID): Secondary | ICD-10-CM | POA: Diagnosis not present

## 2014-12-07 DIAGNOSIS — Z7982 Long term (current) use of aspirin: Secondary | ICD-10-CM | POA: Insufficient documentation

## 2014-12-07 DIAGNOSIS — M629 Disorder of muscle, unspecified: Secondary | ICD-10-CM | POA: Insufficient documentation

## 2014-12-07 DIAGNOSIS — R42 Dizziness and giddiness: Secondary | ICD-10-CM | POA: Diagnosis not present

## 2014-12-07 DIAGNOSIS — R531 Weakness: Secondary | ICD-10-CM | POA: Insufficient documentation

## 2014-12-07 DIAGNOSIS — R279 Unspecified lack of coordination: Secondary | ICD-10-CM | POA: Diagnosis not present

## 2014-12-07 HISTORY — DX: Tachycardia, unspecified: R00.0

## 2014-12-07 LAB — URINALYSIS COMPLETE WITH MICROSCOPIC (ARMC ONLY)
BILIRUBIN URINE: NEGATIVE
Bacteria, UA: NONE SEEN
Glucose, UA: NEGATIVE mg/dL
Hgb urine dipstick: NEGATIVE
KETONES UR: NEGATIVE mg/dL
LEUKOCYTES UA: NEGATIVE
NITRITE: NEGATIVE
Protein, ur: NEGATIVE mg/dL
RBC / HPF: NONE SEEN RBC/hpf (ref 0–5)
SPECIFIC GRAVITY, URINE: 1.006 (ref 1.005–1.030)
WBC, UA: NONE SEEN WBC/hpf (ref 0–5)
pH: 7 (ref 5.0–8.0)

## 2014-12-07 LAB — BASIC METABOLIC PANEL
Anion gap: 9 (ref 5–15)
BUN: 13 mg/dL (ref 6–20)
CALCIUM: 9.6 mg/dL (ref 8.9–10.3)
CO2: 26 mmol/L (ref 22–32)
CREATININE: 0.54 mg/dL (ref 0.44–1.00)
Chloride: 98 mmol/L — ABNORMAL LOW (ref 101–111)
GFR calc Af Amer: >60 mL/min (ref >60)
GFR calc non Af Amer: >60 mL/min (ref >60)
Glucose, Bld: 124 mg/dL — ABNORMAL HIGH (ref 65–99)
Potassium: 4 mmol/L (ref 3.5–5.1)
SODIUM: 133 mmol/L — AB (ref 135–145)

## 2014-12-07 LAB — CBC
HCT: 42.4 % (ref 35.0–47.0)
HEMOGLOBIN: 14.1 g/dL (ref 12.0–16.0)
MCH: 28.8 pg (ref 26.0–34.0)
MCHC: 33.3 g/dL (ref 32.0–36.0)
MCV: 86.5 fL (ref 80.0–100.0)
PLATELETS: 315 10*3/uL (ref 150–440)
RBC: 4.9 MIL/uL (ref 3.80–5.20)
RDW: 13 % (ref 11.5–14.5)
WBC: 6.8 10*3/uL (ref 3.6–11.0)

## 2014-12-07 MED ORDER — MECLIZINE HCL 25 MG PO TABS
25.0000 mg | ORAL_TABLET | Freq: Once | ORAL | Status: AC
  Administered 2014-12-07: 25 mg via ORAL
  Filled 2014-12-07: qty 1

## 2014-12-07 MED ORDER — MECLIZINE HCL 25 MG PO TABS: 25.0000 mg | ORAL_TABLET | Freq: Three times a day (TID) | ORAL | Status: AC | PRN

## 2014-12-07 NOTE — ED Notes (Signed)
Pt returned from CT via stretcher.

## 2014-12-07 NOTE — ED Notes (Signed)
Pt finished with physical therapy became dizzy and light headed,grips equal, no weakness

## 2014-12-07 NOTE — Patient Instructions (Signed)
Continue with practicing  Exhalation with rising from chair   Exhalation with stairclimbing     Isometric holds with semi-tandem stance       Start by kitchen counter: standing perpendicular R hand on counter.  Feet hip width apart        Step L foot forward like you are on ski tracks. L knee bent over ankle. Push thigh into L hand and count aloud to 5 secs.        Step feet together again. Repeat 2 more times.    For the other leg, turn to the other direction so your L hand is on counter and your R foot steps forward.     Remember to pace your activities and practice breathing and body scan as ways to manage stress and conserve energy

## 2014-12-07 NOTE — ED Provider Notes (Signed)
[facility] Emergency Department Provider Note  ____________________________________________  Time seen: 1710   I have reviewed the triage vital signs and the nursing notes.   HISTORY  Chief Complaint Dizziness     HPI Kristin Nunez is a 74 y.o. femalewho began to feel dizzy at physical therapy today. Due to the dizziness, physical therapy was stopped. They measured her blood pressure at approximately 140 systolic. They let her drink something and retook her blood pressure. It went 150. Due to this mild elevation, they thought it best for her to be seen in the emergency department.   The patient makes reference to feeling lightheaded. With further discussion, trying to clarify this, she reports she really feels dizzy and off-balance and is not having any near syncopal episodes.  Upon presentation in the emergency department, her blood pressure was reasonable at 142/70, but after 12:00 began to escalate and will went to 170/90 follow by 192/92.  She denies any focal weakness. She does complain of a headache on the left side that also extends into the left neck.  Patient does report she had an episode where she became off balance and fell into thousand 13. Following that she was told she had crystals in her ears.    Past Medical History  Diagnosis Date  . Hypertension   . Tachycardia   . Atrial fibrillation (HCC)   . Diverticula of colon   . 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 Sinus tachycardia seen on cardiac monitor   . Atrial fibrillation (HCC)     There are no active problems to display for this patient.   Past Surgical History  Procedure Laterality Date  . Colon resection    . Abdominal hysterectomy      prolapsed uterus   . Trigger thumb release       R  . Hip fracture surgery Right 2013    from a fall, also fx R shoulder: (plate/pins present in UE, pins LE)   . Eye surgery Left     cataract     Current  Outpatient Rx  Name  Route  Sig  Dispense  Refill  . aspirin EC 81 MG tablet   Oral   Take 81 mg by mouth daily.         . diphenhydrAMINE (SOMINEX) 25 MG tablet   Oral   Take 25 mg by mouth at bedtime as needed for sleep.         . meclizine (ANTIVERT) 25 MG tablet   Oral   Take 1 tablet (25 mg total) by mouth 3 (three) times daily as needed for dizziness.   12 tablet   1   . meloxicam (MOBIC) 15 MG tablet   Oral   Take 15 mg by mouth daily.         . metoprolol succinate (TOPROL-XL) 100 MG 24 hr tablet   Oral   Take 100 mg by mouth daily. Take with or immediately following a meal.         . nitrofurantoin, macrocrystal-monohydrate, (MACROBID) 100 MG capsule   Oral   Take 1 capsule (100 mg total) by mouth 2 (two) times daily. Patient not taking: Reported on 10/07/2014   14 capsule   0   . pantoprazole (PROTONIX) 20 MG tablet   Oral   Take 20 mg by mouth daily.         . phenazopyridine (PYRIDIUM) 100 MG  tablet   Oral   Take 1 tablet (100 mg total) by mouth 3 (three) times daily as needed for pain. Patient not taking: Reported on 10/07/2014   6 tablet   0     Allergies Ciprofloxacin; Demerol; Flagyl; Meprednisone; Morphine and related; Promethazine; Sulfa antibiotics; and Toradol  No family history on file.  Social History Social History  Substance Use Topics  . Smoking status: Never Smoker   . Smokeless tobacco: None  . Alcohol Use: No    Review of Systems  Constitutional: Negative for fever. ENT: Negative for sore throat. Cardiovascular: Negative for chest pain. Respiratory: Negative for cough. Gastrointestinal: Negative for abdominal pain, vomiting and diarrhea. Genitourinary: Negative for dysuria. Musculoskeletal: History of a fall with right-sided fractures of hip and shoulder in the distant past. Skin: Negative for rash. Neurological: Positive for headache and for feeling off balance. See history of present illness   10-point ROS  otherwise negative.  ____________________________________________   PHYSICAL EXAM:  VITAL SIGNS: ED Triage Vitals  Enc Vitals Group     BP 12/07/14 1310 192/92 mmHg     Pulse Rate 12/07/14 1310 72     Resp 12/07/14 1310 18     Temp 12/07/14 1310 98 F (36.7 C)     Temp Source 12/07/14 1310 Oral     SpO2 12/07/14 1310 99 %     Weight 12/07/14 1310 165 lb (74.844 kg)     Height 12/07/14 1310  (1.676 m)     Head Cir --      Peak Flow --      Pain Score --      Pain Loc --      Pain Edu? --      Excl. in GC? --     Constitutional: Alert and oriented. Appears a little bit uncomfortable but in no distress. ENT   Head: Normocephalic and atraumatic.   Nose: No congestion/rhinnorhea.       Mouth: No erythema, no swelling   Cardiovascular: Normal rate, regular rhythm, no murmur noted Respiratory:  Normal respiratory effort, no tachypnea.    Breath sounds are clear and equal bilaterally.  Gastrointestinal: Soft and nontender. No distention.  Back: No muscle spasm, no tenderness, no CVA tenderness. Musculoskeletal: No deformity noted. Nontender with normal range of motion in all extremities.  No noted edema. Neurologic: Alert and communicative. Grip strength equal bilaterally. Negative pronator drift, negative finger to nose, patient with difficulty standing and with Romberg. 5 out of 5 strength in all 4 extremities. Cranial nerves II-12 are intact. Skin:  Skin is warm, dry. No rash noted. Psychiatric: Mood and affect are normal. Speech and behavior are normal.  ____________________________________________    LABS (pertinent positives/negatives)  Labs Reviewed  BASIC METABOLIC PANEL - Abnormal; Notable for the following:    Sodium 133 (*)    Chloride 98 (*)    Glucose, Bld 124 (*)    All other components within normal limits  URINALYSIS COMPLETEWITH MICROSCOPIC ([facility] ONLY) - Abnormal; Notable for the following:    Color, Urine YELLOW (*)    APPearance CLEAR (*)     Squamous Epithelial / LPF 0-5 (*)    All other components within normal limits  CBC  CBG MONITORING, ED     ____________________________________________   EKG  ED ECG REPORT I, Marisa Hufstetler W, the attending physician, personally viewed and interpreted this ECG.   Date: 12/07/2014  EKG Time: 13 and 7  Rate: 70  Rhythm: Normal sinus  rhythm  Axis: Normal  Intervals: Normal  ST&T Change: None noted   ____________________________________________    RADIOLOGY  CT head IMPRESSION: Patchy small vessel disease throughout the periventricular white matter, stable. No acute infarct evident. No hemorrhage or mass effect. 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  MRI brain:  IMPRESSION: 1. No acute intracranial abnormality. 2. Extensive white matter disease. This is nonspecific, but likely reflects the sequela of chronic microvascular ischemia. 3. 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  ____________________________________________  ____________________________________________   INITIAL IMPRESSION / ASSESSMENT AND PLAN / ED COURSE  Pertinent labs & imaging results that were available during my care of the patient were reviewed by me and considered in my medical decision making (see chart for details).   74 year old female with vertigo, headache, ongoing difficulty standing, and elevated blood pressure. I'm concerned about possible cerebellar ischemia. CT scan showed small vessel disease but no acute changes. I have called MRI to arrange for imaging to rule out CVA. We will treat her with meclizine currently.  ----------------------------------------- 7:47 PM on 12/07/2014 -----------------------------------------  MRI scan does not show any acute intracranial abnormality. She does have extensive white matter disease. At this time, after meclizine, the patient feels better and feels comfortable and ready to go home. She is  moving her head spontaneously without any significant symptoms of vertigo.  ____________________________________________   FINAL CLINICAL IMPRESSION(S) / ED DIAGNOSES  Final diagnoses:  Vertigo      Darien Ramusavid W Seraiah Nowack, MD 12/07/14 1954

## 2014-12-07 NOTE — Discharge Instructions (Signed)
Your CT scan and MRI of the head and brain did not show any acute new changes. Take meclizine as needed for a sense of dizziness. If you feel like her to pass out, if you have chest pain or shortness of breath, or if you have other urgent concerns, return to the emergency department.  Dizziness Dizziness is a common problem. It makes you feel unsteady or lightheaded. You may feel like you are about to pass out (faint). Dizziness can lead to injury if you stumble or fall. Anyone can get dizzy, but dizziness is more common in older adults. This condition can be caused by a number of things, including:  Medicines.  Dehydration.  Illness. HOME CARE Following these instructions may help with your condition: Eating and Drinking  Drink enough fluid to keep your pee (urine) clear or pale yellow. This helps to keep you from getting dehydrated. Try to drink more clear fluids, such as water.  Do not drink alcohol.  Limit how much caffeine you drink or eat if told by your doctor.  Limit how much salt you drink or eat if told by your doctor. Activity  Avoid making quick movements.  When you stand up from sitting in a chair, steady yourself until you feel okay.  In the morning, first sit up on the side of the bed. When you feel okay, stand slowly while you hold onto something. Do this until you know that your balance is fine.  Move your legs often if you need to stand in one place for a long time. Tighten and relax your muscles in your legs while you are standing.  Do not drive or use heavy machinery if you feel dizzy.  Avoid bending down if you feel dizzy. Place items in your home so that they are easy for you to reach without leaning over. Lifestyle  Do not use any tobacco products, including cigarettes, chewing tobacco, or electronic cigarettes. If you need help quitting, ask your doctor.  Try to lower your stress level, such as with yoga or meditation. Talk with your doctor if you need  help. General Instructions  Watch your dizziness for any changes.  Take medicines only as told by your doctor. Talk with your doctor if you think that your dizziness is caused by a medicine that you are taking.  Tell a friend or a family member that you are feeling dizzy. If he or she notices any changes in your behavior, have this person call your doctor.  Keep all follow-up visits as told by your doctor. This is important. GET HELP IF:  Your dizziness does not go away.  Your dizziness or light-headedness gets worse.  You feel sick to your stomach (nauseous).  You have trouble hearing.  You have new symptoms.  You are unsteady on your feet or you feel like the room is spinning. GET HELP RIGHT AWAY IF:  You throw up (vomit) or have diarrhea and are unable to eat or drink anything.  You have trouble:  Talking.  Walking.  Swallowing.  Using your arms, hands, or legs.  You feel generally weak.  You are not thinking clearly or you have trouble forming sentences. It may take a friend or family member to notice this.  You have:  Chest pain.  Pain in your belly (abdomen).  Shortness of breath.  Sweating.  Your vision changes.  You are bleeding.  You have a headache.  You have neck pain or a stiff neck.  You have  a fever.   This information is not intended to replace advice given to you by your health care provider. Make sure you discuss any questions you have with your health care provider.   Document Released: 01/10/2011 Document Revised: 06/07/2014 Document Reviewed: 01/17/2014 Elsevier Interactive Patient Education [employer]

## 2014-12-08 NOTE — Therapy (Addendum)
[facility] MAIN [facility] SERVICES [address] [place], [place], [address] Phone: [phone]   Fax:  [phone]  Physical Therapy Treatment  Patient Details  Name: Kristin Nunez MRN: [id number] Date of Birth: Aug 08, 1940 Referring Provider: Virl Diamond  Encounter Date: 12/07/2014      PT End of Session - 12/09/14 1506    Visit Number 12   Number of Visits 12   Date for PT Re-Evaluation 12/30/14   Authorization Type G-code 10th visit (2/10)    Activity Tolerance Patient tolerated treatment well;Other (comment)  treatment ended with c/o of lightedness, dizzines and escalated BP   Behavior During Therapy [facility] for tasks assessed/performed      Past Medical History  Diagnosis Date  . Hypertension   . Tachycardia   . Atrial fibrillation (HCC)   . Diverticula of colon   . 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 Sinus tachycardia seen on cardiac monitor   . Atrial fibrillation [facility])     Past Surgical History  Procedure Laterality Date  . Colon resection    . Abdominal hysterectomy      prolapsed uterus   . Trigger thumb release       R  . Hip fracture surgery Right 2013    from a fall, also fx R shoulder: (plate/pins present in UE, pins LE)   . Eye surgery Left     cataract     Filed Vitals:   12/07/14 1140 12/07/14 1200 12/07/14 1205 12/07/14 1240  BP: 140/82 150/90 132/90 170/90    Visit Diagnosis:  Lack of coordination  Fascial defect  Weakness      Subjective Assessment - 12/09/14 1504    Subjective Pt reported she was able to notice how her breathing has helped with her singing voice. Pt stated she practiced the sidestepping up along her stairs as part of her HEP and the clam shell exercises did not cause pain. Pt reported she had followed the recommendation to try oatmeal in the morning as a way to regain her apetite which she had report at last session to have diminished recently and pt does not feel like  eating anything in the morning. Pt also reported she has been concerned about her poor memory.     Pertinent History Hx of colon resection (to treat diverticulitis) and has had only one attack post-surgery, Hx of complete hysterectemy, post-polio, R hip surgery from Fx,.  Current routine: 45-90 min swimming 3x/wk    Patient Stated Goals increase mm strength / control to make it to the bathroom             [facility] PT Assessment - 12/09/14 1505    Assessment   Medical Diagnosis Urge Incontinence   Strength   Overall Strength Comments hip abd 3/5 R with pain, L 4/5    Ambulation/Gait   Stairs --  side stepping UE support over 6 steps no difficulty                     [facility] Adult PT Treatment/Exercise - 12/08/14 1655    Self-Care   Other Self-Care Comments  education on stress management, energy conservation, later with complaints of light headedness, vitals were taken and pt was monitored. Blood pressure did not drop despite rest and intake of food and water.   Therapeutic Activites    ADL's sidestepping up 6 steps with BUE on rails. 3 trials  with cues for feet alignment. 3rd trial, pt reported dizziness/lightheadedness upon reaching the top of the steps. Pt was able to descend and sit into chair with CGA.BP was taken at this time and again 20 min and 30 min later. (See vitals)     Neuro Re-ed    Neuro Re-ed Details  alignment of feet in static mini-forward lunge with UE on counter and isometric holds 5 sec with opp UE against forward thigh to engage hip abductors. x 3 reps each LE. Noted slight LOB with delayed forward stepping of RLE near last trials.    Exercises   Other Exercises  NU-Step 10 min Level 0, hip flexor and piriformis stretch bilaterally 5 breaths    pillow not needed today, pt demo'd upright posture                PT Education - 12/09/14 1506    Education provided Yes   Education Details HEP   Person(s) Educated Patient   Methods  Explanation;Demonstration;Tactile cues;Verbal cues;Handout   Comprehension Verbalized understanding;Returned demonstration;Verbal cues required;Tactile cues required             PT Long Term Goals - 12/01/14 1249    PT LONG TERM GOAL #1   Title Pt will decrease her score on PFDI from 53.5% to less than 45% to demo improved pelvic floor function to perform ADLs.   Time 12   Period Weeks   Status On-going   PT LONG TERM GOAL #2   Title Pt will decrease her time with sit-to-stand from 32 sec to < 30 sec with UE support in order to access her toilet w/ decreased risk for falls.   Time 12   Period Weeks   Status On-going   PT LONG TERM GOAL #3   Title Pt will report compliance with toileting posture and bladder irritant to water ratio recommendations to improve urinary frequency/ constipation and decrease risk of dehydration/hospitalization.    Time 12   Period Weeks   Status Achieved   PT LONG TERM GOAL #4   Title Pt will demo increased gait speed from 0.64 m/s to >.70 m/s with use SPC in order to decrease risk for falls.    Time 12   Period Weeks   Status On-going   PT LONG TERM GOAL #5   Title Pt demo dynamic stabilization exercises Level 1-4 10 reps without cuing and signs of lumbopelvic instability in order progress to dynamic stability exercises.    Time 12   Period Weeks   Status On-going               Plan - 12/08/14 1757    Clinical Impression Statement Pt progressed with general conditioning exercises with application of deep core muscles. Treatment ended with c/o of lightedness, dizzines and escalated BP that did not drop despite resting periods and intake of snack and water (See vitals above). Pt was escorted ED in a WC by PT  and pt was tended to by ED staff immediately.  PT plans to notify referring provider re:  high BP, light headedness, and report of recent loss of apetite.  PT also will f/u w/ pt.  PT sessions will be withheld until cleared by MD.     Pt  will benefit from skilled therapeutic intervention in order to improve on the following deficits Abnormal gait;Decreased activity tolerance;Decreased balance;Decreased mobility;Decreased strength;Improper body mechanics;Decreased scar mobility;Hypomobility;Pain;Difficulty walking;Decreased coordination;Decreased range of motion;Decreased safety awareness;Decreased endurance;Increased fascial restricitons;Impaired tone   Rehab  Potential Good   Clinical Impairments Affecting Rehab Potential Pt's Hx of hysterectemy due to pelvic organ prolapse, post-polio syndrome, and colon resection surgery    PT Frequency 1x / week   PT Duration 12 weeks   PT Treatment/Interventions ADLs/Self Care Home Management;Cryotherapy;Biofeedback;Electrical Stimulation;Functional mobility training;Stair training;Gait training;Traction;Moist Heat;Therapeutic activities;Therapeutic exercise;Balance training;Neuromuscular re-education;Patient/family education;Passive range of motion;Scar mobilization;Manual techniques;Dry needling;Taping   Consulted and Agree with Plan of Care Patient        Problem List There are no active problems to display for this patient.   Elisha PonderYeung,Shin Yiing,PT, DPT, E-RYT  12/09/2014, 3:07 PM  [facility] MAIN [facility] [address] [place], [place], [id number] Phone: [phone]   Fax:  [phone]  Name: Beatrix FettersJudy J Nunez MRN: [id number] Date of Birth: 27-Apr-1940

## 2014-12-09 ENCOUNTER — Telehealth: Payer: Self-pay | Admitting: Physical Therapy

## 2014-12-09 NOTE — Telephone Encounter (Signed)
Pt had left a message 11/3 re: the neg CT scan from ED and being put on medication for vertigo. PT called pt today 11/4  to check in on pt's condition and pt reports feeling better to day but her BP continues to be fluctuating. Pt states she will be seeing her PCP on 11/7 and her cardiologist on 11/15.  PT informed pt that PT will be withheld until clearance from her providers. Pt voiced understanding.

## 2014-12-09 NOTE — Telephone Encounter (Signed)
PT called and left voicemail message at Dr. Heywood FootmanStoioff's office with an update on pt's medical condition and withholding PT until further clearance. A recent treatment note had also been faxed to his office as well.  Pt also called Dr. Dannielle Huhlmedo's office and left a message with his RN re:pt's escalated BP after recent PT session, admission / discharge from ED for vertigo, continued fluctuation of BP and her c/o poor memory and recent lack of appetite.

## 2014-12-12 ENCOUNTER — Ambulatory Visit: Payer: Medicare Other | Admitting: Physical Therapy

## 2014-12-14 ENCOUNTER — Encounter: Payer: Medicare Other | Admitting: Physical Therapy

## 2014-12-14 ENCOUNTER — Telehealth: Payer: Self-pay | Admitting: Physical Therapy

## 2014-12-14 NOTE — Telephone Encounter (Signed)
PT called pt to f/u. Pt saw her PCP and will also be seeing her cardiologist as recommended by her PCP 11/15. Pt was also recommended to see her ENT MD as well. Pt reports she is interested in returning to pelvic health PT to continue addressing her pelvic floor issues.    PT called office of Dr. Zada Finderslmedo and spoke with triage RN on getting clearance for pt to resume Pelvic Health PT.

## 2014-12-15 ENCOUNTER — Telehealth: Payer: Self-pay | Admitting: Physical Therapy

## 2014-12-15 NOTE — Telephone Encounter (Signed)
PT received a call 12/15/14 from Dr. Dannielle Huhlmedo's nurse stating that pt was Dx with BPPV and she may start PT as long as she can tolerate it.

## 2014-12-19 ENCOUNTER — Encounter: Payer: Medicare Other | Admitting: Physical Therapy

## 2014-12-21 ENCOUNTER — Ambulatory Visit: Payer: Medicare Other | Admitting: Physical Therapy

## 2014-12-21 DIAGNOSIS — R279 Unspecified lack of coordination: Secondary | ICD-10-CM | POA: Diagnosis not present

## 2014-12-21 DIAGNOSIS — M629 Disorder of muscle, unspecified: Secondary | ICD-10-CM

## 2014-12-21 DIAGNOSIS — R531 Weakness: Secondary | ICD-10-CM

## 2014-12-21 NOTE — Patient Instructions (Signed)
PELVIC FLOOR / KEGEL EXERCISES   Pelvic floor/ Kegel exercises are used to strengthen the muscles in the base of your pelvis that are responsible for supporting your pelvic organs and preventing urine/feces leakage. Based on your therapist's recommendations, they can be performed while standing, sitting, or lying down. Imagine pelvic floor area as a diamond with pelvic landmarks: top =pubic bone, bottom tip=tailbone, sides=sitting bones (ischial tuberosities).    Make yourself aware of this muscle group by using these cues while coordinating your breath:  Inhale, feel pelvic floor diamond area lower like hammock towards your feet and ribcage/belly expanding. Pause. Let the exhale naturally and feel your belly sink, abdominal muscles hugging in around you and you may notice the pelvic diamond draws upward towards your head forming a umbrella shape. Give a squeeze during the exhalation like you are stopping the flow of urine. If you are squeezing the buttock muscles, try to give 50% less effort.   Common Errors:  Breath holding: If you are holding your breath, you may be bearing down against your bladder instead of pulling it up. If you belly bulges up while you are squeezing, you are holding your breath. Be sure to breathe gently in and out while exercising. Counting out loud may help you avoid holding your breath.  Accessory muscle use: You should not see or feel other muscle movement when performing pelvic floor exercises. When done properly, no one can tell that you are performing the exercises. Keep the buttocks, belly and inner thighs relaxed.  Overdoing it: Your muscles can fatigue and stop working for you if you over-exercise. You may actually leak more or feel soreness at the lower abdomen or rectum.  YOUR HOME EXERCISE PROGRAM  LONG HOLDS: Position: on back  Inhale and then exhale. Then squeeze the muscle and count aloud for 10 seconds. Rest with three long breaths. (Be sure to let  belly sink in with exhales and not push outward)  Perform 4 repetitions, 3 times/day                       DECREASE DOWNWARD PRESSURE ON  YOUR PELVIC FLOOR, ABDOMINAL, LOW BACK MUSCLES       PRESERVE YOUR PELVIC HEALTH LONG-TERM   ** SQUEEZE pelvic floor BEFORE YOUR SNEEZE, COUGH, LAUGH   ** EXHALE BEFORE YOU RISE AGAINST GRAVITY (lifting, sit to stand, from squat to stand)   ** LOG ROLL OUT OF BED INSTEAD OF CRUNCH/SIT-UP

## 2014-12-22 NOTE — Therapy (Addendum)
[facility] MAIN [facility] SERVICES [address] [place], [place], [address] Phone: [phone]   Fax:  [phone]  Physical Therapy Treatment / Progress Note  Patient Details  Name: Kristin Nunez MRN: [id number] Date of Birth: 10-24-1940 Referring Provider: Virl Diamond  Encounter Date: 12/21/2014      PT End of Session - 12/22/14 1922    Visit Number 13   Number of Visits 12   Date for PT Re-Evaluation 12/30/14   Authorization Type G-code 10th visit (3/10)    PT Start Time 1107   PT Stop Time 1200   PT Time Calculation (min) 53 min   Activity Tolerance Patient tolerated treatment well;Other (comment)  treatment ended with c/o of lightedness, dizzines and escalated BP   Behavior During Therapy [facility] for tasks assessed/performed      Past Medical History  Diagnosis Date  . Hypertension   . Tachycardia   . Atrial fibrillation (HCC)   . Diverticula of colon   . 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 Sinus tachycardia seen on cardiac monitor   . Atrial fibrillation [facility])     Past Surgical History  Procedure Laterality Date  . Colon resection    . Abdominal hysterectomy      prolapsed uterus   . Trigger thumb release       R  . Hip fracture surgery Right 2013    from a fall, also fx R shoulder: (plate/pins present in UE, pins LE)   . Eye surgery Left     cataract     There were no vitals filed for this visit.  Visit Diagnosis:  Lack of coordination - Plan: PT plan of care cert/re-cert  Weakness - Plan: PT plan of care cert/re-cert  Fascial defect - Plan: PT plan of care cert/re-cert      Subjective Assessment - 12/23/14 0900    Subjective Pt reported she is feeling better with her dizziness and has f/u with her ENT, cardiologist, and PCP. Pt states her cardiologist be monitoring her with heart monitor because at her last visit, her BP was 160/96 w/ am irregular  heartbeat.  Pt also stated her ENT MD is  performing vertigo diagnostic tests.     Pertinent History Hx of colon resection (to treat diverticulitis) and has had only one attack post-surgery, Hx of complete hysterectemy, post-polio, R hip surgery from Fx,.  Current routine: 45-90 min swimming 3x/wk    Patient Stated Goals increase mm strength / control to make it to the bathroom                       Pelvic Floor Special Questions - 12/22/14 1616    Pelvic Floor Internal Exam pt verbally consented without contraindications    Exam Type Vaginal   Palpation no tightness    Strength # of reps 4   Strength # of seconds 10   Biofeedback proper coordination with minmal cuing           [facility] Adult PT Treatment/Exercise - 12/22/14 1919    Self-Care   Other Self-Care Comments  education on relationship of OSA and A-fib,  possible nocturia with untreated OSA    Neuro Re-ed    Neuro Re-ed Details  pelvic floor exercises endurance holds   Exercises   Other Exercises  PT Long Term Goals - 12/23/14 0809    PT LONG TERM GOAL #1   Title Pt will decrease her score on PFDI from 53.5% to less than 45% to demo improved pelvic floor function to perform ADLs.   Time 12   Period Weeks   Status On-going   PT LONG TERM GOAL #2   Title Pt will decrease her time with sit-to-stand from 32 sec to < 30 sec with UE support in order to access her toilet w/ decreased risk for falls.   Time 12   Period Weeks   Status On-going   PT LONG TERM GOAL #3   Title Pt will report compliance with toileting posture and bladder irritant to water ratio recommendations to improve urinary frequency/ constipation and decrease risk of dehydration/hospitalization.    Time 12   Period Weeks   Status Achieved   PT LONG TERM GOAL #4   Title Pt will demo increased gait speed from 0.64 m/s to >.70 m/s with use SPC in order to decrease risk for falls.    Time 12   Period Weeks   Status On-going   PT LONG TERM GOAL #5    Title Pt demo dynamic stabilization exercises Level 1-4 10 reps without cuing and signs of lumbopelvic instability in order progress to dynamic stability exercises.    Time 12   Period Weeks   Status On-going   Additional Long Term Goals   Additional Long Term Goals Yes   PT LONG TERM GOAL #6   Title Pt will demo pelvic floor strength 4/10/7/7 in order to imrpove urinary function.   Time 12   Period Weeks   Status New               Plan - 12/23/14 [id number]    Clinical Impression Statement Pt returns to PT with clearance from Dr.  Dannielle Huhlmedo's office (PCP). Pt showed good carry over with pelvic floor coordination and was able to progress to endurance training. Pt reported that she has tried decreasing her bladder irritant intake, especially hours prior to bed but she has not noticed a change in decreasing the multiple times she gets up to urinate at night. Pt stated she has been following up with her cardiologist and ENT re: the dizziness/ spiked BP epsiode that occurred at her last PT session. Her BP at the end of PT today was 120/64.  PT will continue to monitor her BP at future appts and modify exercises accordingly. PT educated pt and provided research articles about the association with OSA and A-fib (2015 Nunzio CoryLyons et al), OSA and nocturia (2010 Aurelio BrashRomero et al), and the treatment of OSA with CPAP to decrease nocturia frequency (2006 Sampson GoonFitzgerald et al).  PT left a message with Dr.Stoiff Surveyor, quantity(Urologist) and Theme park managerDeen (Cardiologist) w/ a suggestion for OSA Screening. Pt will benefit from continued skilled PT to continue strengthening her pelvic floor mm and applying her deep core activation into functional activities.  An extension of her cert for 12 more weeks is accompanying this note.         Pt will benefit from skilled therapeutic intervention in order to improve on the following deficits Abnormal gait;Decreased activity tolerance;Decreased balance;Decreased mobility;Decreased strength;Improper body  mechanics;Decreased scar mobility;Hypomobility;Pain;Difficulty walking;Decreased coordination;Decreased range of motion;Decreased safety awareness;Decreased endurance;Increased fascial restricitons;Impaired tone   Rehab Potential Good   Clinical Impairments Affecting Rehab Potential Pt's Hx of hysterectemy due to pelvic organ prolapse, post-polio syndrome, and colon resection surgery    PT Frequency 1x /  week   PT Duration 12 weeks   PT Treatment/Interventions ADLs/Self Care Home Management;Cryotherapy;Biofeedback;Electrical Stimulation;Functional mobility training;Stair training;Gait training;Traction;Moist Heat;Therapeutic activities;Therapeutic exercise;Balance training;Neuromuscular re-education;Patient/family education;Passive range of motion;Scar mobilization;Manual techniques;Dry needling;Taping   Consulted and Agree with Plan of Care Patient        Problem List There are no active problems to display for this patient.   [name] ,PT, DPT, E-RYT  12/23/2014, 9:00 AM  [facility] MAIN [facility] SERVICES [address] [place], [place], [address] Phone: [phone]   Fax:  [phone]  Name: Kristin Nunez MRN: [id number] Date of Birth: 11-05-1940

## 2014-12-23 ENCOUNTER — Telehealth: Payer: Self-pay | Admitting: Physical Therapy

## 2014-12-23 ENCOUNTER — Encounter: Payer: Medicare Other | Admitting: Physical Therapy

## 2014-12-23 NOTE — Telephone Encounter (Signed)
Pelvic Health PT  left a message on nurse line for Dr. August Saucerean [phone] with a suggestion for screening pt for OSA given pt's cardiac conditions and c/o nocturia.

## 2014-12-23 NOTE — Addendum Note (Signed)
Addended by: Mariane MastersYEUNG, SHIN-YIING on: 12/23/2014 09:15 AM   Modules accepted: Orders

## 2014-12-28 ENCOUNTER — Encounter: Payer: Medicare Other | Admitting: Physical Therapy

## 2015-01-23 ENCOUNTER — Ambulatory Visit
Admission: EM | Admit: 2015-01-23 | Discharge: 2015-01-23 | Disposition: A | Discharge status: Home / Self Care | Payer: Medicare Other | Attending: Emergency Medicine | Admitting: Emergency Medicine

## 2015-01-23 DIAGNOSIS — I10 Essential (primary) hypertension: Secondary | ICD-10-CM | POA: Diagnosis not present

## 2015-01-23 DIAGNOSIS — N390 Urinary tract infection, site not specified: Secondary | ICD-10-CM | POA: Diagnosis not present

## 2015-01-23 DIAGNOSIS — R300 Dysuria: Secondary | ICD-10-CM | POA: Diagnosis present

## 2015-01-23 DIAGNOSIS — I4891 Unspecified atrial fibrillation: Secondary | ICD-10-CM | POA: Diagnosis not present

## 2015-01-23 LAB — URINALYSIS COMPLETE WITH MICROSCOPIC (ARMC ONLY)
BILIRUBIN URINE: NEGATIVE
GLUCOSE, UA: NEGATIVE mg/dL
KETONES UR: NEGATIVE mg/dL
Nitrite: NEGATIVE
PH: 8 (ref 5.0–8.0)
Protein, ur: 30 mg/dL — AB
SQUAMOUS EPITHELIAL / LPF: NONE SEEN
Specific Gravity, Urine: 1.02 (ref 1.005–1.030)

## 2015-01-23 MED ORDER — PHENAZOPYRIDINE HCL 200 MG PO TABS: 200.0000 mg | ORAL_TABLET | Freq: Three times a day (TID) | ORAL | Status: DC | PRN

## 2015-01-23 MED ORDER — NITROFURANTOIN MONOHYD MACRO 100 MG PO CAPS: 100.0000 mg | ORAL_CAPSULE | Freq: Two times a day (BID) | ORAL | Status: DC

## 2015-01-23 NOTE — ED Notes (Signed)
Hx of UTI's. Woke this am with urinary burning and stinging and "chills". Also c/o suprapubic pain. "I just need Macrobid and Pyridium"

## 2015-01-23 NOTE — ED Provider Notes (Signed)
HPI  SUBJECTIVE:  Kristin Nunez is a 74 y.o. female who presents with urinary urgency, frequency, dysuria, cloudy, odorous urine starting today. She reports lower abdominal pain/pressure after urinating. Symptoms are better with Tylenol no aggravating factors. She tried Tylenol, rinsing her genitalia after urinating, and clobetasol. No nausea, vomiting, fever, chills, back pain.  No hematuria.  No anorexia, other abdominal pain.  No vaginal bleeding or discharge/odor, vulvar itching, genital rash.  Patient is in a monoagmous sexual relationship with a female partner who is asxatic. STDs are not a concern today. Patient has  taken an antipyretic in the past 4-6 hours.. No recent antibiotic. Past medical history of UTIs, hypertension, vertigo. Has negative history negative for diabetes, nephrolithiasis, pyelonephritis.  States this feels identical to previous UTIs which usually resolve with Macrobid and Pyridium. Pt  is not a smoker.     Past Medical History  Diagnosis Date  . Hypertension   . Tachycardia   . Atrial fibrillation (HCC)   . Diverticula of colon   . 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 Sinus tachycardia seen on cardiac monitor   . Atrial fibrillation [facility](HCC)     Past Surgical History  Procedure Laterality Date  . Colon resection    . Abdominal hysterectomy      prolapsed uterus   . Trigger thumb release       R  . Hip fracture surgery Right 2013    from a fall, also fx R shoulder: (plate/pins present in UE, pins LE)   . Eye surgery Left     cataract     History reviewed. No pertinent family history.  Social History  Substance Use Topics  . Smoking status: Never Smoker   . Smokeless tobacco: None  . Alcohol Use: No    No current facility-administered medications for this encounter.  Current outpatient prescriptions:  .  aspirin EC 81 MG tablet, Take 81 mg by mouth daily., Disp: , Rfl:  .  meclizine (ANTIVERT) 25 MG tablet, Take 1 tablet (25 mg  total) by mouth 3 (three) times daily as needed for dizziness., Disp: 12 tablet, Rfl: 1 .  metoprolol succinate (TOPROL-XL) 100 MG 24 hr tablet, Take 100 mg by mouth daily. Take with or immediately following a meal., Disp: , Rfl:  .  pantoprazole (PROTONIX) 20 MG tablet, Take 20 mg by mouth daily., Disp: , Rfl:  .  diphenhydrAMINE (SOMINEX) 25 MG tablet, Take 25 mg by mouth at bedtime as needed for sleep., Disp: , Rfl:  .  nitrofurantoin, macrocrystal-monohydrate, (MACROBID) 100 MG capsule, Take 1 capsule (100 mg total) by mouth 2 (two) times daily. X 5 days, Disp: 10 capsule, Rfl: 0 .  phenazopyridine (PYRIDIUM) 200 MG tablet, Take 1 tablet (200 mg total) by mouth 3 (three) times daily as needed for pain., Disp: 6 tablet, Rfl: 0  Allergies  Allergen Reactions  . Ciprofloxacin   . Demerol [Meperidine]   . Flagyl [Metronidazole]   . Meprednisone   . Morphine And Related   . Promethazine   . Sulfa Antibiotics   . Toradol [Ketorolac Tromethamine]      ROS  As noted in HPI.   Physical Exam  BP 138/62 mmHg  Pulse 72  Temp(Src) 97 F (36.1 C) (Tympanic)  Resp 17  Ht 5\' 6"  (1.676 m)  Wt 165 lb (74.844 kg)  BMI 26.64 kg/m2  SpO2 100%  Constitutional: Well developed, well nourished, no acute  distress Eyes:  EOMI, conjunctiva normal bilaterally HENT: Normocephalic, atraumatic,mucus membranes moist Respiratory: Normal inspiratory effort Cardiovascular: Normal rate GI: Positive suprapubic tenderness. Normal appearance, soft, otherwise nontender, nondistended. Normal bowel sounds.  Back: No CVAT  GU: Deferred  skin: No rash, skin intact Musculoskeletal: no deformities Neurologic: Alert & oriented x 3, no focal neuro deficits Psychiatric: Speech and behavior appropriate   ED Course   Medications - No data to display  Orders Placed This Encounter  Procedures  . Urine culture    Standing Status: Standing     Number of Occurrences: 1     Standing Expiration Date:     Order  Specific Question:  Patient immune status    Answer:  Normal  . Urinalysis complete, with microscopic    Standing Status: Standing     Number of Occurrences: 1     Standing Expiration Date:     Results for orders placed or performed during the hospital encounter of 01/23/15 (from the past 24 hour(s))  Urinalysis complete, with microscopic     Status: Abnormal   Collection Time: 01/23/15  1:56 PM  Result Value Ref Range   Color, Urine YELLOW YELLOW   APPearance CLOUDY (A) CLEAR   Glucose, UA NEGATIVE NEGATIVE mg/dL   Bilirubin Urine NEGATIVE NEGATIVE   Ketones, ur NEGATIVE NEGATIVE mg/dL   Specific Gravity, Urine 1.020 1.005 - 1.030   Hgb urine dipstick 2+ (A) NEGATIVE   pH 8.0 5.0 - 8.0   Protein, ur 30 (A) NEGATIVE mg/dL   Nitrite NEGATIVE NEGATIVE   Leukocytes, UA 2+ (A) NEGATIVE   RBC / HPF TOO NUMEROUS TO COUNT 0 - 5 RBC/hpf   WBC, UA TOO NUMEROUS TO COUNT 0 - 5 WBC/hpf   Bacteria, UA FEW (A) NONE SEEN   Squamous Epithelial / LPF NONE SEEN NONE SEEN  Urine culture     Status: None (Preliminary result)   Collection Time: 01/23/15  1:56 PM  Result Value Ref Range   Specimen Description URINE, CLEAN CATCH    Special Requests Normal    Culture NO GROWTH < 24 HOURS    Report Status PENDING    No results found.  ED Clinical Impression  UTI (lower urinary tract infection)  ED Assessment/Plan  Positive hematuria, 2+ leuks, proteinuria, bacteriuria consistent with UTI. Negative nitrite. Sent off for culture to confirm antibiotic choice. Patient to give [place] a working phone number if we need to change your antibiotics.  Previous urine culture 07/2014 was Escherichia coli UTI that was pansensitive. Home with Macrobid, Pyridium, increase fluids. Follow-up with PMD as needed. Go to the ER if gets worse, fevers, abdominal pain, back pain.  Discussed labs,  MDM, plan and followup with patient. Discussed sn/sx that should prompt return to the UC or ED. Patient  agrees with plan.    *This clinic note was created using Dragon dictation software. Therefore, there may be occasional mistakes despite careful proofreading.  ?    Domenick Gong, MD 01/24/15 [id number]

## 2015-01-25 LAB — URINE CULTURE
Culture: NO GROWTH
Special Requests: NORMAL

## 2015-02-15 ENCOUNTER — Ambulatory Visit: Payer: Medicare Other | Admitting: Physical Therapy

## 2015-02-22 ENCOUNTER — Ambulatory Visit: Payer: Medicare Other | Admitting: Physical Therapy

## 2015-03-15 ENCOUNTER — Ambulatory Visit: Payer: Medicare Other | Attending: Urology | Admitting: Physical Therapy

## 2015-03-15 DIAGNOSIS — R531 Weakness: Secondary | ICD-10-CM | POA: Diagnosis present

## 2015-03-15 DIAGNOSIS — R279 Unspecified lack of coordination: Secondary | ICD-10-CM

## 2015-03-15 DIAGNOSIS — M629 Disorder of muscle, unspecified: Secondary | ICD-10-CM | POA: Insufficient documentation

## 2015-03-16 NOTE — Therapy (Addendum)
[facility] MAIN [facility] SERVICES [address] [place], [place], [address] Phone: [phone]   Fax:  [phone]  Physical Therapy Treatment  Patient Details  Name: Kristin Nunez MRN: [id number] Date of Birth: 1940/08/22 Referring Provider: Virl Diamond  Encounter Date: 03/15/2015    Past Medical History  Diagnosis Date  . Hypertension   . Tachycardia   . Atrial fibrillation (HCC)   . Diverticula of colon   . 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 Sinus tachycardia seen on cardiac monitor   . Atrial fibrillation [facility] [place])     Past Surgical History  Procedure Laterality Date  . Colon resection    . Abdominal hysterectomy      prolapsed uterus   . Trigger thumb release       R  . Hip fracture surgery Right 2013    from a fall, also fx R shoulder: (plate/pins present in UE, pins LE)   . Eye surgery Left     cataract     There were no vitals filed for this visit.  Visit Diagnosis:  Lack of coordination - Plan: PT plan of care cert/re-cert, CANCELED: PT plan of care cert/re-cert  Weakness - Plan: PT plan of care cert/re-cert, CANCELED: PT plan of care cert/re-cert  Fascial defect - Plan: PT plan of care cert/re-cert, CANCELED: PT plan of care cert/re-cert      Subjective Assessment - 03/23/15 2202    Subjective Pt returns to PT after 3 months and her medication conditions have improved (i.e. flu, dizziness, UTI). Currently, pt denied UTI. Pt also reports she ahs been walking 2-3 miles a day. Pt states her bowel movements are smoother and she is taking a supplement 6 weeks called 911 that she got over the internet to clear her  GI  tract.  Pt decreased Miralax to one does less compared to before. Pt  reports she is sleeping better and decreased nocturia from 3-4x / night to 1x / night.  Pt reports her pad remains dry 3-4 x/week.  Pt 's remaining issue is incompletely emptying with bowels.      Pertinent History Hx of colon  resection (to treat diverticulitis) and has had only one attack post-surgery, Hx of complete hysterectemy, post-polio, R hip surgery from Fx,.  Current routine: 45-90 min swimming 3x/wk    Patient Stated Goals increase mm strength / control to make it to the bathroom                       Pelvic Floor Special Questions - 03/23/15 2203    Pelvic Floor Internal Exam pt verbally consented without contraindications    Exam Type Vaginal   Palpation no tightness    Strength fair squeeze, definite lift  reported back spasms w/ contraction cue   Biofeedback overuse of back extensor mm w/ cue for contraction, no spasms w/ cue to not contract/ breathing coordination only.   withheld endurance trainings 2/2 hyperactivity           [facility] Adult PT Treatment/Exercise - 03/16/15 1940    Neuro Re-ed    Neuro Re-ed Details  breathing coordination replaced pelvic floor endurance training  2/2 hyperactivity  explained excessive effort w/ overuse of extensor mm                      PT Long Term Goals - 03/16/15 1944  PT LONG TERM GOAL #1   Title Pt will decrease her score on PFDI from 53.5% to less than 45% to demo improved pelvic floor function to perform ADLs.   Time 12   Period Weeks   Status On-going   PT LONG TERM GOAL #2   Title Pt will decrease her time with sit-to-stand from 32 sec to < 30 sec with UE support in order to access her toilet w/ decreased risk for falls.   Time 12   Period Weeks   Status On-going   PT LONG TERM GOAL #3   Title Pt will report compliance with toileting posture and bladder irritant to water ratio recommendations to improve urinary frequency/ constipation and decrease risk of dehydration/hospitalization.    Time 12   Period Weeks   Status Achieved   PT LONG TERM GOAL #4   Title Pt will demo increased gait speed from 0.64 m/s to >.70 m/s with use SPC in order to decrease risk for falls.    Time 12   Period Weeks   Status On-going    PT LONG TERM GOAL #5   Title Pt demo dynamic stabilization exercises Level 1-4 10 reps without cuing and signs of lumbopelvic instability in order progress to dynamic stability exercises.    Time 12   Period Weeks   Status On-going   Additional Long Term Goals   Additional Long Term Goals Yes   PT LONG TERM GOAL #6   Title Pt will demo pelvic floor strength 4/10/7/7 in order to imrpove urinary function.   Time 12   Period Weeks   Status Deferred   PT LONG TERM GOAL #7   Title Pt will report decreased wet pads from 3-4x/week to 1-2x/week in order to show improved pelvic floor function.   Time 12   Period Weeks   Status New               Plan - 03/16/15 1942    Clinical Impression Statement Pt returns to PT after 3 months after her  medical conditions have stabilized. Pt reports her nocturia and night time  leakage have decreased but she remains motivated to continue improving  with pelvic floor rehab. Pt also reported her elimination of bowels have  improved to some degree but she continues to feel as if she has not  completely emptied fully. Today pt shows significantly improved  coordination of her pelvic floor mm but does require more cuing to relax  pelvic floor 2/2 hyperactivity and compensatory strategies. Pt will  benefit from continued PT. A re-cert accompanies this note with a request  For more visits.       Pt will benefit from skilled therapeutic intervention in order to improve on the following deficits Abnormal gait;Decreased activity tolerance;Decreased balance;Decreased mobility;Decreased strength;Improper body mechanics;Decreased scar mobility;Hypomobility;Pain;Difficulty walking;Decreased coordination;Decreased range of motion;Decreased safety awareness;Decreased endurance;Increased fascial restricitons;Impaired tone   Rehab Potential Good   Clinical Impairments Affecting Rehab Potential Pt's Hx of hysterectemy due to pelvic organ prolapse, post-polio  syndrome, and colon resection surgery    PT Frequency 1x / week   PT Duration 8 weeks   PT Treatment/Interventions ADLs/Self Care Home Management; Aquatic Therapy, Cryotherapy;Biofeedback;Electrical Stimulation;Functional mobility training;Stair training;Gait training;Traction;Moist Heat;Therapeutic activities;Therapeutic exercise;Balance training;Neuromuscular re-education;Patient/family education;Passive range of motion;Scar mobilization;Manual techniques;Dry needling;Taping   Consulted and Agree with Plan of Care Patient        Problem List There are no active problems to display for this patient.   Kristin Nunez ,PT,  DPT, E-RYT  03/23/2015, 10:04 PM  [place] [facility] MAIN [facility] SERVICES [address] [place], [place], [address] Phone: [phone]   Fax:  [phone]  Name: MERARI PION MRN: [id number] Date of Birth: Sep 27, 1940

## 2015-03-16 NOTE — Patient Instructions (Signed)
Withhold pelvic floor holds. Perform diaphragmatic breathing with pelvic floor movement to decrease effort and overuse of back muscles.

## 2015-03-23 ENCOUNTER — Encounter: Payer: Medicare Other | Admitting: Physical Therapy

## 2015-03-31 ENCOUNTER — Encounter: Payer: Self-pay | Admitting: *Deleted

## 2015-03-31 ENCOUNTER — Ambulatory Visit: Payer: Medicare Other | Admitting: Physical Therapy

## 2015-03-31 NOTE — Discharge Instructions (Signed)

## 2015-04-03 ENCOUNTER — Ambulatory Visit: Payer: Medicare Other | Admitting: Anesthesiology

## 2015-04-03 ENCOUNTER — Ambulatory Visit
Admission: RE | Admit: 2015-04-03 | Discharge: 2015-04-03 | Disposition: A | Discharge status: Home / Self Care | Payer: Medicare Other | Source: Ambulatory Visit | Attending: Ophthalmology | Admitting: Ophthalmology

## 2015-04-03 ENCOUNTER — Encounter
Admission: RE | Disposition: A | Discharge status: Home / Self Care | Payer: Self-pay | Source: Ambulatory Visit | Attending: Ophthalmology

## 2015-04-03 DIAGNOSIS — K219 Gastro-esophageal reflux disease without esophagitis: Secondary | ICD-10-CM | POA: Diagnosis not present

## 2015-04-03 DIAGNOSIS — Z9049 Acquired absence of other specified parts of digestive tract: Secondary | ICD-10-CM | POA: Insufficient documentation

## 2015-04-03 DIAGNOSIS — L719 Rosacea, unspecified: Secondary | ICD-10-CM | POA: Diagnosis not present

## 2015-04-03 DIAGNOSIS — E079 Disorder of thyroid, unspecified: Secondary | ICD-10-CM | POA: Insufficient documentation

## 2015-04-03 DIAGNOSIS — Z9842 Cataract extraction status, left eye: Secondary | ICD-10-CM | POA: Diagnosis not present

## 2015-04-03 DIAGNOSIS — G43909 Migraine, unspecified, not intractable, without status migrainosus: Secondary | ICD-10-CM | POA: Diagnosis not present

## 2015-04-03 DIAGNOSIS — R011 Cardiac murmur, unspecified: Secondary | ICD-10-CM | POA: Diagnosis not present

## 2015-04-03 DIAGNOSIS — H25041 Posterior subcapsular polar age-related cataract, right eye: Secondary | ICD-10-CM | POA: Diagnosis not present

## 2015-04-03 DIAGNOSIS — E7800 Pure hypercholesterolemia, unspecified: Secondary | ICD-10-CM | POA: Diagnosis not present

## 2015-04-03 DIAGNOSIS — Z888 Allergy status to other drugs, medicaments and biological substances status: Secondary | ICD-10-CM | POA: Insufficient documentation

## 2015-04-03 DIAGNOSIS — R42 Dizziness and giddiness: Secondary | ICD-10-CM | POA: Diagnosis not present

## 2015-04-03 DIAGNOSIS — M25569 Pain in unspecified knee: Secondary | ICD-10-CM | POA: Insufficient documentation

## 2015-04-03 DIAGNOSIS — I499 Cardiac arrhythmia, unspecified: Secondary | ICD-10-CM | POA: Insufficient documentation

## 2015-04-03 DIAGNOSIS — M1990 Unspecified osteoarthritis, unspecified site: Secondary | ICD-10-CM | POA: Diagnosis not present

## 2015-04-03 DIAGNOSIS — R05 Cough: Secondary | ICD-10-CM | POA: Insufficient documentation

## 2015-04-03 DIAGNOSIS — Z882 Allergy status to sulfonamides status: Secondary | ICD-10-CM | POA: Insufficient documentation

## 2015-04-03 DIAGNOSIS — R002 Palpitations: Secondary | ICD-10-CM | POA: Diagnosis not present

## 2015-04-03 DIAGNOSIS — J40 Bronchitis, not specified as acute or chronic: Secondary | ICD-10-CM | POA: Diagnosis not present

## 2015-04-03 DIAGNOSIS — Z881 Allergy status to other antibiotic agents status: Secondary | ICD-10-CM | POA: Insufficient documentation

## 2015-04-03 DIAGNOSIS — Z90710 Acquired absence of both cervix and uterus: Secondary | ICD-10-CM | POA: Insufficient documentation

## 2015-04-03 DIAGNOSIS — I10 Essential (primary) hypertension: Secondary | ICD-10-CM | POA: Insufficient documentation

## 2015-04-03 DIAGNOSIS — G14 Postpolio syndrome: Secondary | ICD-10-CM | POA: Diagnosis not present

## 2015-04-03 HISTORY — DX: Bronchitis, not specified as acute or chronic: J40

## 2015-04-03 HISTORY — DX: Chronic cough: R05.3

## 2015-04-03 HISTORY — DX: Cardiac arrhythmia, unspecified: I49.9

## 2015-04-03 HISTORY — DX: Unspecified osteoarthritis, unspecified site: M19.90

## 2015-04-03 HISTORY — DX: Diverticulosis of intestine, part unspecified, without perforation or abscess without bleeding: K57.90

## 2015-04-03 HISTORY — DX: Headache, unspecified: R51.9

## 2015-04-03 HISTORY — DX: Dizziness and giddiness: R42

## 2015-04-03 HISTORY — PX: CATARACT EXTRACTION W/PHACO: SHX586

## 2015-04-03 HISTORY — DX: Cardiac murmur, unspecified: R01.1

## 2015-04-03 HISTORY — DX: Rosacea, unspecified: L71.9

## 2015-04-03 HISTORY — DX: Gastro-esophageal reflux disease without esophagitis: K21.9

## 2015-04-03 HISTORY — DX: Headache: R51

## 2015-04-03 HISTORY — DX: Pneumonia, unspecified organism: J18.9

## 2015-04-03 HISTORY — DX: Cough: R05

## 2015-04-03 HISTORY — DX: Pure hypercholesterolemia, unspecified: E78.00

## 2015-04-03 SURGERY — PHACOEMULSIFICATION, CATARACT, WITH IOL INSERTION
Anesthesia: Monitor Anesthesia Care | Site: Eye | Laterality: Right | Wound class: Clean

## 2015-04-03 MED ORDER — LIDOCAINE HCL 2 % EX GEL: 1.0000 "application " | Freq: Once | CUTANEOUS | Status: DC

## 2015-04-03 MED ORDER — TETRACAINE HCL 0.5 % OP SOLN
1.0000 [drp] | OPHTHALMIC | Status: DC | PRN
Start: 2015-04-03 — End: 2015-04-03
  Administered 2015-04-03: 1 [drp] via OPHTHALMIC

## 2015-04-03 MED ORDER — EPINEPHRINE HCL 1 MG/ML IJ SOLN
INTRAOCULAR | Status: DC | PRN
  Administered 2015-04-03: 83 mL via OPHTHALMIC

## 2015-04-03 MED ORDER — BRIMONIDINE TARTRATE 0.2 % OP SOLN
OPHTHALMIC | Status: DC | PRN
  Administered 2015-04-03: 1 [drp] via OPHTHALMIC

## 2015-04-03 MED ORDER — POLYMYXIN B-TRIMETHOPRIM 10000-0.1 UNIT/ML-% OP SOLN
1.0000 [drp] | OPHTHALMIC | Status: DC | PRN
  Administered 2015-04-03 (×3): 1 [drp] via OPHTHALMIC

## 2015-04-03 MED ORDER — TIMOLOL MALEATE 0.5 % OP SOLN
OPHTHALMIC | Status: DC | PRN
Start: 2015-04-03 — End: 2015-04-03
  Administered 2015-04-03: 1 [drp] via OPHTHALMIC

## 2015-04-03 MED ORDER — FENTANYL CITRATE (PF) 100 MCG/2ML IJ SOLN
INTRAMUSCULAR | Status: DC | PRN
  Administered 2015-04-03: 50 ug via INTRAVENOUS

## 2015-04-03 MED ORDER — CYCLOPENTOLATE HCL 2 % OP SOLN
1.0000 [drp] | OPHTHALMIC | Status: DC | PRN
  Administered 2015-04-03 (×4): 1 [drp] via OPHTHALMIC

## 2015-04-03 MED ORDER — CEFUROXIME OPHTHALMIC INJECTION 1 MG/0.1 ML
INJECTION | OPHTHALMIC | Status: DC | PRN
  Administered 2015-04-03: .1 mL via INTRACAMERAL

## 2015-04-03 MED ORDER — ACETAMINOPHEN 160 MG/5ML PO SOLN: 325.0000 mg | ORAL | Status: DC | PRN

## 2015-04-03 MED ORDER — POVIDONE-IODINE 5 % OP SOLN
1.0000 | OPHTHALMIC | Status: DC | PRN
Start: 2015-04-03 — End: 2015-04-03
  Administered 2015-04-03: 1 via OPHTHALMIC

## 2015-04-03 MED ORDER — LACTATED RINGERS IV SOLN: INTRAVENOUS | Status: DC

## 2015-04-03 MED ORDER — NA HYALUR & NA CHOND-NA HYALUR 0.4-0.35 ML IO KIT
PACK | INTRAOCULAR | Status: DC | PRN
  Administered 2015-04-03: 1 mL via INTRAOCULAR

## 2015-04-03 MED ORDER — LIDOCAINE HCL (PF) 4 % IJ SOLN
INTRAOCULAR | Status: DC | PRN
  Administered 2015-04-03: 4 mL via OPHTHALMIC

## 2015-04-03 MED ORDER — MIDAZOLAM HCL 2 MG/2ML IJ SOLN
INTRAMUSCULAR | Status: DC | PRN
  Administered 2015-04-03: 2 mg via INTRAVENOUS

## 2015-04-03 MED ORDER — PHENYLEPHRINE HCL 10 % OP SOLN
1.0000 [drp] | OPHTHALMIC | Status: DC | PRN
  Administered 2015-04-03 (×4): 1 [drp] via OPHTHALMIC

## 2015-04-03 MED ORDER — ACETAMINOPHEN 325 MG PO TABS: 325.0000 mg | ORAL_TABLET | ORAL | Status: DC | PRN

## 2015-04-03 SURGICAL SUPPLY — 29 items
APPLICATOR COTTON TIP 3IN (MISCELLANEOUS) ×2 IMPLANT
CANNULA ANT/CHMB 27GA (MISCELLANEOUS) ×2 IMPLANT
DISSECTOR HYDRO NUCLEUS 50X22 (MISCELLANEOUS) ×2 IMPLANT
GLOVE BIO SURGEON STRL SZ7 (GLOVE) ×2 IMPLANT
GLOVE SURG LX 6.5 MICRO (GLOVE) ×1
GLOVE SURG LX STRL 6.5 MICRO (GLOVE) ×1 IMPLANT
GOWN STRL REUS W/ TWL LRG LVL3 (GOWN DISPOSABLE) ×2 IMPLANT
GOWN STRL REUS W/TWL LRG LVL3 (GOWN DISPOSABLE) ×2
LENS IOL TECNIS 17.5 (Intraocular Lens) ×2 IMPLANT
LENS IOL TECNIS MONO 1P 17.5 (Intraocular Lens) ×1 IMPLANT
MARKER SKIN DUAL TIP RULER LAB (MISCELLANEOUS) ×2 IMPLANT
NEEDLE FILTER BLUNT 18X 1/2SAF (NEEDLE) ×1
NEEDLE FILTER BLUNT 18X1 1/2 (NEEDLE) ×1 IMPLANT
PACK CATARACT BRASINGTON (MISCELLANEOUS) ×2 IMPLANT
PACK EYE AFTER SURG (MISCELLANEOUS) ×2 IMPLANT
PACK OPTHALMIC (MISCELLANEOUS) ×2 IMPLANT
RING MALYGIN 7.0 (MISCELLANEOUS) IMPLANT
SOL BAL SALT 15ML (MISCELLANEOUS)
SOLUTION BAL SALT 15ML (MISCELLANEOUS) IMPLANT
SUT ETHILON 10-0 CS-B-6CS-B-6 (SUTURE)
SUT VICRYL  9 0 (SUTURE)
SUT VICRYL 9 0 (SUTURE) IMPLANT
SUTURE EHLN 10-0 CS-B-6CS-B-6 (SUTURE) IMPLANT
SYR 3ML LL SCALE MARK (SYRINGE) ×2 IMPLANT
SYR TB 1ML LUER SLIP (SYRINGE) ×2 IMPLANT
WATER STERILE IRR 250ML POUR (IV SOLUTION) ×2 IMPLANT
WATER STERILE IRR 500ML POUR (IV SOLUTION) IMPLANT
WICK EYE OCUCEL (MISCELLANEOUS) IMPLANT
WIPE NON LINTING 3.25X3.25 (MISCELLANEOUS) ×2 IMPLANT

## 2015-04-03 NOTE — Anesthesia Postprocedure Evaluation (Signed)
Anesthesia Post Note  Patient: Kristin Nunez  Procedure(s) Performed: Procedure(s) (LRB): CATARACT EXTRACTION PHACO AND INTRAOCULAR LENS PLACEMENT (IOC) (Right)  Patient location during evaluation: PACU Anesthesia Type: MAC Level of consciousness: awake and alert and oriented Pain management: satisfactory to patient Vital Signs Assessment: post-procedure vital signs reviewed and stable Respiratory status: spontaneous breathing, nonlabored ventilation and respiratory function stable Cardiovascular status: blood pressure returned to baseline and stable Postop Assessment: Adequate PO intake and No signs of nausea or vomiting Anesthetic complications: no    Cherly [place]

## 2015-04-03 NOTE — Transfer of Care (Signed)
Immediate Anesthesia Transfer of Care Note  Patient: Kristin Nunez  Procedure(s) Performed: Procedure(s): CATARACT EXTRACTION PHACO AND INTRAOCULAR LENS PLACEMENT (IOC) (Right)  Patient Location: PACU  Anesthesia Type: MAC  Level of Consciousness: awake, alert  and patient cooperative  Airway and Oxygen Therapy: Patient Spontanous Breathing and Patient connected to supplemental oxygen  Post-op Assessment: Post-op Vital signs reviewed, Patient's Cardiovascular Status Stable, Respiratory Function Stable, Patent Airway and No signs of Nausea or vomiting  Post-op Vital Signs: Reviewed and stable  Complications: No apparent anesthesia complications

## 2015-04-03 NOTE — H&P (Signed)
H+P reviewed and is up to date, please see paper chart.  

## 2015-04-03 NOTE — Anesthesia Procedure Notes (Signed)
Procedure Name: MAC Performed by: Sylvana Bonk Pre-anesthesia Checklist: Patient identified, Emergency Drugs available, Suction available, Timeout performed and Patient being monitored Patient Re-evaluated:Patient Re-evaluated prior to inductionOxygen Delivery Method: Nasal cannula Placement Confirmation: positive ETCO2       

## 2015-04-03 NOTE — Op Note (Signed)
Date of Surgery: 04/03/2015  PREOPERATIVE DIAGNOSES: Visually significant posterior subcapsular cataract, right eye.  POSTOPERATIVE DIAGNOSES: Same  PROCEDURES PERFORMED: Cataract extraction with intraocular lens implant, right eye.  SURGEON: Devin Going, M.D.  ANESTHESIA: MAC and topical  IMPLANTS:   Implant Name Type Inv. Item Serial No. Manufacturer Lot No. LRB No. Used  LENS IOL TECNIS 17.5 - [id number] Intraocular Lens LENS IOL TECNIS 17.5 [id number] AMO   Right 1     COMPLICATIONS: None.  DESCRIPTION OF PROCEDURE: Therapeutic options were discussed with the patient preoperatively, including a discussion of risks and benefits of surgery. Informed consent was obtained. An IOL-Master and immersion biometry were used to take the lens measurements, and a dilated fundus exam was performed within 6 months of the surgical date.  The patient was premedicated and brought to the operating room and placed on the operating table in the supine position. After adequate anesthesia, the patient was prepped and draped in the usual sterile ophthalmic fashion. A wire lid speculum was inserted and the microscope was positioned. A Superblade was used to create a paracentesis site at the limbus and a small amount of dilute preservative free lidocaine was instilled into the anterior chamber, followed by dispersive viscoelastic. A clear corneal incision was created temporally using a 2.4 mm keratome blade. Capsulorrhexis was then performed. In situ phacoemulsification was performed.  Cortical material was removed with the irrigation-aspiration unit. Dispersive viscoelastic was instilled to open the capsular bag. A posterior chamber intraocular lens with the specifications above was inserted and positioned. Irrigation-aspiration was used to remove all viscoelastic. Cefuroxime 1cc was instilled into the anterior chamber, and the corneal incision was checked and found to be water tight. The eyelid speculum was  removed.  The operative eye was covered with protective goggles after instilling 1 drop of timolol and brimonidine. The patient tolerated the procedure well. There were no complications.

## 2015-04-03 NOTE — Anesthesia Preprocedure Evaluation (Signed)
Anesthesia Evaluation  Patient identified by MRN, date of birth, ID band  Reviewed: Allergy & Precautions, H&P , NPO status , Patient's Chart, lab work & pertinent test results  Airway Mallampati: II  TM Distance: >3 FB Neck ROM: full    Dental no notable dental hx.    Pulmonary    Pulmonary exam normal       Cardiovascular hypertension, + dysrhythmias Atrial Fibrillation Rhythm:regular Rate:Normal     Neuro/Psych    GI/Hepatic GERD-  ,  Endo/Other    Renal/GU      Musculoskeletal   Abdominal   Peds  Hematology   Anesthesia Other Findings   Reproductive/Obstetrics                             Anesthesia Physical Anesthesia Plan  ASA: II  Anesthesia Plan: MAC   Post-op Pain Management:    Induction:   Airway Management Planned:   Additional Equipment:   Intra-op Plan:   Post-operative Plan:   Informed Consent: I have reviewed the patients History and Physical, chart, labs and discussed the procedure including the risks, benefits and alternatives for the proposed anesthesia with the patient or authorized representative who has indicated his/her understanding and acceptance.     Plan Discussed with: CRNA  Anesthesia Plan Comments:         Anesthesia Quick Evaluation  

## 2015-04-04 ENCOUNTER — Encounter: Payer: Self-pay | Admitting: Ophthalmology

## 2015-04-07 ENCOUNTER — Ambulatory Visit: Payer: Medicare Other | Admitting: Physical Therapy

## 2015-05-10 ENCOUNTER — Ambulatory Visit: Payer: Medicare Other | Attending: Urology | Admitting: Physical Therapy

## 2015-06-01 ENCOUNTER — Encounter: Payer: Self-pay | Admitting: *Deleted

## 2015-06-01 ENCOUNTER — Ambulatory Visit
Admission: EM | Admit: 2015-06-01 | Discharge: 2015-06-01 | Disposition: A | Discharge status: Home / Self Care | Payer: Medicare Other | Attending: Family Medicine | Admitting: Family Medicine

## 2015-06-01 DIAGNOSIS — M1990 Unspecified osteoarthritis, unspecified site: Secondary | ICD-10-CM | POA: Diagnosis not present

## 2015-06-01 DIAGNOSIS — I10 Essential (primary) hypertension: Secondary | ICD-10-CM | POA: Diagnosis not present

## 2015-06-01 DIAGNOSIS — I4891 Unspecified atrial fibrillation: Secondary | ICD-10-CM | POA: Diagnosis not present

## 2015-06-01 DIAGNOSIS — Z7982 Long term (current) use of aspirin: Secondary | ICD-10-CM | POA: Insufficient documentation

## 2015-06-01 DIAGNOSIS — R300 Dysuria: Secondary | ICD-10-CM | POA: Insufficient documentation

## 2015-06-01 DIAGNOSIS — R350 Frequency of micturition: Secondary | ICD-10-CM | POA: Insufficient documentation

## 2015-06-01 DIAGNOSIS — Z9889 Other specified postprocedural states: Secondary | ICD-10-CM | POA: Insufficient documentation

## 2015-06-01 DIAGNOSIS — Z79899 Other long term (current) drug therapy: Secondary | ICD-10-CM | POA: Insufficient documentation

## 2015-06-01 DIAGNOSIS — K219 Gastro-esophageal reflux disease without esophagitis: Secondary | ICD-10-CM | POA: Insufficient documentation

## 2015-06-01 DIAGNOSIS — N390 Urinary tract infection, site not specified: Secondary | ICD-10-CM | POA: Diagnosis not present

## 2015-06-01 DIAGNOSIS — R3915 Urgency of urination: Secondary | ICD-10-CM | POA: Insufficient documentation

## 2015-06-01 DIAGNOSIS — E7800 Pure hypercholesterolemia, unspecified: Secondary | ICD-10-CM | POA: Diagnosis not present

## 2015-06-01 DIAGNOSIS — Z888 Allergy status to other drugs, medicaments and biological substances status: Secondary | ICD-10-CM | POA: Diagnosis not present

## 2015-06-01 LAB — URINALYSIS COMPLETE WITH MICROSCOPIC (ARMC ONLY)
BILIRUBIN URINE: NEGATIVE
GLUCOSE, UA: NEGATIVE mg/dL
Ketones, ur: NEGATIVE mg/dL
Nitrite: NEGATIVE
Protein, ur: NEGATIVE mg/dL
Specific Gravity, Urine: 1.015 (ref 1.005–1.030)
Squamous Epithelial / LPF: NONE SEEN
pH: 6 (ref 5.0–8.0)

## 2015-06-01 MED ORDER — PHENAZOPYRIDINE HCL 200 MG PO TABS
200.0000 mg | ORAL_TABLET | Freq: Three times a day (TID) | ORAL | Status: DC | PRN
Start: 2015-06-01 — End: 2015-09-21

## 2015-06-01 MED ORDER — NITROFURANTOIN MONOHYD MACRO 100 MG PO CAPS: 100.0000 mg | ORAL_CAPSULE | Freq: Two times a day (BID) | ORAL | Status: DC

## 2015-06-01 MED ORDER — CLOBETASOL PROPIONATE 0.05 % EX CREA: 1.0000 "application " | TOPICAL_CREAM | Freq: Two times a day (BID) | CUTANEOUS | Status: AC

## 2015-06-01 NOTE — ED Provider Notes (Signed)
CSN: [id number]     Arrival date & time 06/01/15  4098 History   First MD Initiated Contact with Patient 06/01/15 [phone]    Nurses notes were reviewed.   Chief Complaint  Patient presents with  . Dysuria  . Urinary Frequency  . Urinary Urgency   Patient reports dysuria and urinary frequency and urgency that started on Monday. She states she used her usual treatments hemorrhages los water and Temovate on the outer area of her vaginal area for irritation. Despite that things may get better since she's ready have Macrobid prescription refilled. She has informed me several times that she needs her Macrobid prescription is asked what works. Patient does not seem interested in my estimation of using Macrobid only if the pH is under 7 otherwise macular polyphagia. Also discussed patient is Diflucan she declines his wants refill of her Temovate prescription. She states she just reacts to me different things and she does want me basically trying anything new on her.     (Consider location/radiation/quality/duration/timing/severity/associated sxs/prior Treatment) Patient is a 75 y.o. female presenting with dysuria and frequency. The history is provided by the patient. No language interpreter was used.  Dysuria Pain quality:  Burning Pain severity:  Moderate Onset quality:  Sudden Duration:  4 days Timing:  Constant Progression:  Worsening Chronicity:  Recurrent Relieved by:  Nothing Ineffective treatments:  Cranberry juice Urinary symptoms: frequent urination and incontinence   Urinary Frequency    Past Medical History  Diagnosis Date  . Hypertension   . Tachycardia   . Atrial fibrillation (HCC)   . Diverticula of colon   . 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 Sinus tachycardia seen on cardiac monitor   . Atrial fibrillation (HCC)   . Bronchitis   . Pneumonia     in the past  . Dysrhythmia   . Heart murmur   . Vertigo   . Diverticulosis   . GERD  (gastroesophageal reflux disease)   . Rosacea   . Hypercholesterolemia   . Chronic cough   . Arthritis     knees, neck  . Headache     migraines in past.  now sinus   Past Surgical History  Procedure Laterality Date  . Colon resection    . Abdominal hysterectomy      prolapsed uterus   . Trigger thumb release       R  . Hip fracture surgery Right 2013    from a fall, also fx R shoulder: (plate/pins present in UE, pins LE)   . Appendectomy    . Eye surgery Left     cataract   . Colon surgery      resection  . Tonsillectomy    . Cataract extraction w/phaco Right 04/03/2015    Procedure: CATARACT EXTRACTION PHACO AND INTRAOCULAR LENS PLACEMENT (IOC);  Surgeon: Sherald Hess, MD;  Location: [facility];  Service: Ophthalmology;  Laterality: Right;   History reviewed. No pertinent family history. Social History  Substance Use Topics  . Smoking status: Never Smoker   . Smokeless tobacco: None  . Alcohol Use: No   OB History    No data available     Review of Systems  Genitourinary: Positive for dysuria and frequency.  All other systems reviewed and are negative.   Allergies  Chocolate; Ciprofloxacin; Demerol; Flagyl; Meprednisone; Morphine and related; Promethazine; Sulfa antibiotics; and Toradol  Home Medications   Prior to Admission medications  Medication Sig Start Date End Date Taking? Authorizing Provider  Apoaequorin (PREVAGEN PO) Take by mouth daily.   Yes Historical Provider, MD  Ascorbic Acid (VITAMIN C) 100 MG tablet Take 100 mg by mouth daily.   Yes Historical Provider, MD  aspirin EC 81 MG tablet Take 81 mg by mouth daily.   Yes Historical Provider, MD  calcium-vitamin D (OSCAL WITH D) 500-200 MG-UNIT tablet Take 1 tablet by mouth daily.   Yes Historical Provider, MD  Cholecalciferol (VITAMIN D3) 1000 units CAPS Take by mouth.   Yes Historical Provider, MD  clobetasol ointment (TEMOVATE) 0.05 % Apply 1 application topically as needed.    Yes Historical Provider, MD  diphenhydrAMINE (SOMINEX) 25 MG tablet Take 25 mg by mouth at bedtime as needed for sleep.   Yes Historical Provider, MD  estrogens, conjugated, (PREMARIN) 0.625 MG tablet Take 0.625 mg by mouth 2 (two) times a week. Take daily for 21 days then do not take for 7 days.   Yes Historical Provider, MD  Magnesium 100 MG CAPS Take 258 mg by mouth.   Yes Historical Provider, MD  metoprolol succinate (TOPROL-XL) 100 MG 24 hr tablet Take 25 mg by mouth 2 (two) times daily. Take with or immediately following a meal.   Yes Historical Provider, MD  Multiple Vitamins-Minerals (CENTRUM SILVER PO) Take by mouth daily.   Yes Historical Provider, MD  polyethylene glycol (MIRALAX / GLYCOLAX) packet Take 17 g by mouth 3 (three) times a week.   Yes Historical Provider, MD  Polyvinyl Alcohol-Povidone (REFRESH OP) Apply to eye 3 (three) times daily.   Yes Historical Provider, MD  clobetasol cream (TEMOVATE) 0.05 % Apply 1 application topically 2 (two) times daily. 06/01/15   Hassan Rowan, MD  meclizine (ANTIVERT) 25 MG tablet Take 1 tablet (25 mg total) by mouth 3 (three) times daily as needed for dizziness. Patient taking differently: Take 25 mg by mouth as needed for dizziness.  12/07/14   Darien Ramus, MD  meloxicam (MOBIC) 15 MG tablet Take 15 mg by mouth as needed for pain.    Historical Provider, MD  nitrofurantoin, macrocrystal-monohydrate, (MACROBID) 100 MG capsule Take 1 capsule (100 mg total) by mouth 2 (two) times daily. 06/01/15   Hassan Rowan, MD  pantoprazole (PROTONIX) 20 MG tablet Take 20 mg by mouth as needed.     Historical Provider, MD  phenazopyridine (PYRIDIUM) 200 MG tablet Take 1 tablet (200 mg total) by mouth 3 (three) times daily as needed for pain. 06/01/15   Hassan Rowan, MD   Meds Ordered and Administered this Visit  Medications - No data to display  BP 133/70 mmHg  Pulse 68  Temp(Src) 97.5 F (36.4 C) (Oral)  Resp 16  Ht  (1.676 m)  Wt 163 lb (73.936 kg)   BMI 26.32 kg/m2  SpO2 98% No data found.   Physical Exam  Constitutional: She is oriented to person, place, and time. She appears well-developed and well-nourished.  HENT:  Head: Normocephalic and atraumatic.  Eyes: Pupils are equal, round, and reactive to light.  Neck: Normal range of motion.  Abdominal: Soft. There is no tenderness. There is no CVA tenderness.  Musculoskeletal: Normal range of motion. She exhibits no tenderness.  Neurological: She is alert and oriented to person, place, and time.  Skin: Skin is dry.  Psychiatric: She has a normal mood and affect.  Vitals reviewed.   ED Course  Procedures (including critical care time)  Labs Review Labs Reviewed  URINALYSIS  COMPLETEWITH MICROSCOPIC ([facility] ONLY) - Abnormal; Notable for the following:    APPearance CLOUDY (*)    Hgb urine dipstick 3+ (*)    Leukocytes, UA 3+ (*)    Bacteria, UA FEW (*)    All other components within normal limits  URINE CULTURE    Imaging Review No results found.   Visual Acuity Review  Right Eye Distance:   Left Eye Distance:   Bilateral Distance:    Right Eye Near:   Left Eye Near:    Bilateral Near:     Results for orders placed or performed during the hospital encounter of 06/01/15  Urinalysis complete, with microscopic  Result Value Ref Range   Color, Urine YELLOW YELLOW   APPearance CLOUDY (A) CLEAR   Glucose, UA NEGATIVE NEGATIVE mg/dL   Bilirubin Urine NEGATIVE NEGATIVE   Ketones, ur NEGATIVE NEGATIVE mg/dL   Specific Gravity, Urine 1.015 1.005 - 1.030   Hgb urine dipstick 3+ (A) NEGATIVE   pH 6.0 5.0 - 8.0   Protein, ur NEGATIVE NEGATIVE mg/dL   Nitrite NEGATIVE NEGATIVE   Leukocytes, UA 3+ (A) NEGATIVE   RBC / HPF TOO NUMEROUS TO COUNT 0 - 5 RBC/hpf   WBC, UA TOO NUMEROUS TO COUNT 0 - 5 WBC/hpf   Bacteria, UA FEW (A) NONE SEEN   Squamous Epithelial / LPF NONE SEEN NONE SEEN     MDM   1. UTI (lower urinary tract infection)     Fortunately for everyone  her pH of the urine was 6. We'll place on Macrobid milligrams 1 capsule twice a day for a week we will place her on Pyridium 200 mg 1 tablet 3 times a day for 5 days and renew her prescription for Temovate cream to use on the vaginal outside area. Instructed to follow-up PCP if not better in 1-2 weeks.   Note: This dictation was prepared with Dragon dictation along with smaller phrase technology. Any transcriptional errors that result from this process are unintentional.  Hassan RowanEugene Kharis Lapenna, MD 06/01/15 [phone]

## 2015-06-01 NOTE — ED Notes (Signed)
Dysuria, urinary frequency and urgency. States hx of UTI's.

## 2015-06-01 NOTE — Discharge Instructions (Signed)

## 2015-06-03 ENCOUNTER — Telehealth (HOSPITAL_COMMUNITY): Payer: Self-pay | Admitting: Internal Medicine

## 2015-06-03 DIAGNOSIS — N390 Urinary tract infection, site not specified: Secondary | ICD-10-CM

## 2015-06-03 LAB — URINE CULTURE: SPECIAL REQUESTS: NORMAL

## 2015-06-03 MED ORDER — CEPHALEXIN 500 MG PO CAPS: 500.0000 mg | ORAL_CAPSULE | Freq: Two times a day (BID) | ORAL | Status: AC

## 2015-06-03 NOTE — ED Notes (Signed)
Please let patient know that urine culture was positive for Klebsiella with intermediate sensitivity to nitrofurantoin but is sensitive to cephalexin.   Rx cephalexin sent to pharmacy of record, [facility] [place].  Recheck or followup pcp/Dr Zada Finderslmedo for further evaluation if symptoms persist.    Eustace MooreLaura W Murray, MD 06/03/15 [phone]

## 2015-08-02 ENCOUNTER — Other Ambulatory Visit: Payer: Self-pay | Admitting: Surgery

## 2015-08-02 DIAGNOSIS — M4726 Other spondylosis with radiculopathy, lumbar region: Secondary | ICD-10-CM

## 2015-08-21 ENCOUNTER — Ambulatory Visit
Admission: RE | Admit: 2015-08-21 | Discharge: 2015-08-21 | Disposition: A | Discharge status: Home / Self Care | Payer: Medicare Other | Source: Ambulatory Visit | Attending: Surgery | Admitting: Surgery

## 2015-08-21 DIAGNOSIS — M4726 Other spondylosis with radiculopathy, lumbar region: Secondary | ICD-10-CM | POA: Insufficient documentation

## 2015-08-21 DIAGNOSIS — M4806 Spinal stenosis, lumbar region: Secondary | ICD-10-CM | POA: Diagnosis not present

## 2015-09-21 ENCOUNTER — Ambulatory Visit
Admit: 2015-09-21 | Discharge: 2015-09-21 | Disposition: A | Discharge status: Home / Self Care | Payer: Medicare Other | Attending: Emergency Medicine | Admitting: Emergency Medicine

## 2015-09-21 ENCOUNTER — Encounter: Payer: Self-pay | Admitting: *Deleted

## 2015-09-21 ENCOUNTER — Ambulatory Visit
Admission: EM | Admit: 2015-09-21 | Discharge: 2015-09-21 | Disposition: A | Discharge status: Home / Self Care | Payer: Medicare Other | Attending: Emergency Medicine | Admitting: Emergency Medicine

## 2015-09-21 DIAGNOSIS — M1990 Unspecified osteoarthritis, unspecified site: Secondary | ICD-10-CM | POA: Diagnosis not present

## 2015-09-21 DIAGNOSIS — Z79899 Other long term (current) drug therapy: Secondary | ICD-10-CM | POA: Insufficient documentation

## 2015-09-21 DIAGNOSIS — Z881 Allergy status to other antibiotic agents status: Secondary | ICD-10-CM | POA: Diagnosis not present

## 2015-09-21 DIAGNOSIS — X58XXXA Exposure to other specified factors, initial encounter: Secondary | ICD-10-CM | POA: Insufficient documentation

## 2015-09-21 DIAGNOSIS — Z91018 Allergy to other foods: Secondary | ICD-10-CM | POA: Insufficient documentation

## 2015-09-21 DIAGNOSIS — Z882 Allergy status to sulfonamides status: Secondary | ICD-10-CM | POA: Insufficient documentation

## 2015-09-21 DIAGNOSIS — Z885 Allergy status to narcotic agent status: Secondary | ICD-10-CM | POA: Diagnosis not present

## 2015-09-21 DIAGNOSIS — K219 Gastro-esophageal reflux disease without esophagitis: Secondary | ICD-10-CM | POA: Diagnosis not present

## 2015-09-21 DIAGNOSIS — I4891 Unspecified atrial fibrillation: Secondary | ICD-10-CM | POA: Insufficient documentation

## 2015-09-21 DIAGNOSIS — S060X9A Concussion with loss of consciousness of unspecified duration, initial encounter: Secondary | ICD-10-CM | POA: Insufficient documentation

## 2015-09-21 DIAGNOSIS — Z888 Allergy status to other drugs, medicaments and biological substances status: Secondary | ICD-10-CM | POA: Insufficient documentation

## 2015-09-21 DIAGNOSIS — Z7982 Long term (current) use of aspirin: Secondary | ICD-10-CM | POA: Insufficient documentation

## 2015-09-21 DIAGNOSIS — S0990XA Unspecified injury of head, initial encounter: Secondary | ICD-10-CM | POA: Diagnosis not present

## 2015-09-21 DIAGNOSIS — R51 Headache: Secondary | ICD-10-CM | POA: Diagnosis present

## 2015-09-21 DIAGNOSIS — S060X0A Concussion without loss of consciousness, initial encounter: Secondary | ICD-10-CM | POA: Diagnosis not present

## 2015-09-21 DIAGNOSIS — I10 Essential (primary) hypertension: Secondary | ICD-10-CM | POA: Diagnosis not present

## 2015-09-21 NOTE — ED Triage Notes (Signed)
Pt struck head on shelf  Monday night. Left frontal/parietal region. Has had persistent headache since along with dizziness.

## 2015-09-21 NOTE — ED Provider Notes (Signed)
HPI  SUBJECTIVE:  Kristin Nunez is a 75 y.o. female who presents with a left sided headache after hitting her head on a shelf 4 days ago. She states that she was standing up from crouching position and hit her left temple "hard". She describes the headache as waxing and waning, pressure-like. States that it got better and then got acutely worse last night. She reports nausea today, feeling lightheadedness with large positional changes particularly when going from sitting to standing. It is not present at any other time. Today she had an episode of lightheadedness that lasted several minutes. Her symptoms are worse with turning her head, no alleviating factors. She has also tried ice and Tylenol for this. She denies loss of consciousness, vomiting, fevers, visual changes, facial droop, arm or leg weakness, dysarthria, discoordination, vertigo, photophobia, phonophobia. She denies difficulty concentrating, crying, emotional lability or cognitive slowing. She denies any change her medications. She's had no diarrhea. She is eating and drinking well. She is on aspirin 81 mg, she is not any other anticoagulant antiplatelets. She is also metoprolol. She has a past medical history of hypertension, atrial fibrillation. No history of diabetes, seizure, stroke, aneurysm, concussion. PMD: Dr. Geralynn OchsAlmedo  Past Medical History:  Diagnosis Date  . Arthritis    knees, neck  . Atrial fibrillation (HCC)   . Atrial fibrillation (HCC)   . Bronchitis   . Chronic cough   . Diverticula of colon   . Diverticulosis   . Dysrhythmia   . GERD (gastroesophageal reflux disease)   . Headache    migraines in past.  now sinus  . Heart murmur   . Hypercholesterolemia   . Hypertension   . Pneumonia    in the past  . Post-polio syndrome    Dx at age of 3-4 (LEFT LEG)   . Rosacea   . Sinus tachycardia seen on cardiac monitor   . Tachycardia   . Vertigo     Past Surgical History:  Procedure Laterality Date  . ABDOMINAL  HYSTERECTOMY     prolapsed uterus   . APPENDECTOMY    . CATARACT EXTRACTION W/PHACO Right 04/03/2015   Procedure: CATARACT EXTRACTION PHACO AND INTRAOCULAR LENS PLACEMENT (IOC);  Surgeon: Sherald HessAnita Prakash Vin-Parikh, MD;  [facility];  Service: Ophthalmology;  Laterality: Right;  . COLON RESECTION    . COLON SURGERY     resection  . EYE SURGERY Left    cataract   . HIP FRACTURE SURGERY Right 2013   from a fall, also fx R shoulder: (plate/pins present in UE, pins LE)   . TONSILLECTOMY    . trigger thumb release      R    History reviewed. No pertinent family history.  Social History  Substance Use Topics  . Smoking status: Never Smoker  . Smokeless tobacco: Never Used  . Alcohol use No    No current facility-administered medications for this encounter.   Current Outpatient Prescriptions:  .  Apoaequorin (PREVAGEN PO), Take by mouth daily., Disp: , Rfl:  .  Ascorbic Acid (VITAMIN C) 100 MG tablet, Take 100 mg by mouth daily., Disp: , Rfl:  .  aspirin EC 81 MG tablet, Take 81 mg by mouth daily., Disp: , Rfl:  .  calcium-vitamin D (OSCAL WITH D) 500-200 MG-UNIT tablet, Take 1 tablet by mouth daily., Disp: , Rfl:  .  Cholecalciferol (VITAMIN D3) 1000 units CAPS, Take by mouth., Disp: , Rfl:  .  estrogens, conjugated, (PREMARIN) 0.625 MG tablet,  Take 0.625 mg by mouth 2 (two) times a week. Take daily for 21 days then do not take for 7 days., Disp: , Rfl:  .  Magnesium 100 MG CAPS, Take 258 mg by mouth., Disp: , Rfl:  .  metoprolol succinate (TOPROL-XL) 100 MG 24 hr tablet, Take 25 mg by mouth 2 (two) times daily. Take with or immediately following a meal., Disp: , Rfl:  .  Multiple Vitamins-Minerals (CENTRUM SILVER PO), Take by mouth daily., Disp: , Rfl:  .  polyethylene glycol (MIRALAX / GLYCOLAX) packet, Take 17 g by mouth 3 (three) times a week., Disp: , Rfl:  .  Polyvinyl Alcohol-Povidone (REFRESH OP), Apply to eye 3 (three) times daily., Disp: , Rfl:  .   clobetasol cream (TEMOVATE) 0.05 %, Apply 1 application topically 2 (two) times daily., Disp: 30 g, Rfl: 0 .  clobetasol ointment (TEMOVATE) 0.05 %, Apply 1 application topically as needed., Disp: , Rfl:  .  meclizine (ANTIVERT) 25 MG tablet, Take 1 tablet (25 mg total) by mouth 3 (three) times daily as needed for dizziness. (Patient taking differently: Take 25 mg by mouth as needed for dizziness. ), Disp: 12 tablet, Rfl: 1 .  meloxicam (MOBIC) 15 MG tablet, Take 15 mg by mouth as needed for pain., Disp: , Rfl:   Allergies  Allergen Reactions  . Chocolate     headaches  . Ciprofloxacin     Scratchy throat, felt hot  . Demerol [Meperidine]     Scratchy throat, felt hot, anxious  . Flagyl [Metronidazole]     Scratchy throat, felt hot  . Meprednisone     aggitation  . Morphine And Related     Scratchy throat, felt hot, anxious  . Promethazine     Itchy, hot  . Sulfa Antibiotics     Unknown, as child  . Toradol [Ketorolac Tromethamine]     Scratchy throat, felt hot, anxious     ROS  As noted in HPI.   Physical Exam  BP 127/60 (BP Location: Left Arm)   Pulse 82   Temp 97.8 F (36.6 C) (Tympanic)   Resp 16   Ht 5\' 5"  (1.651 m)   Wt 156 lb (70.8 kg)   SpO2 99%   BMI 25.96 kg/m   Orthostatic VS for the past 24 hrs:  BP- Lying Pulse- Lying BP- Sitting Pulse- Sitting BP- Standing at 0 minutes Pulse- Standing at 0 minutes  09/21/15 1923 107/88 74 138/75 75 149/68 82     Constitutional: Well developed, well nourished, no acute distress Eyes:  PERRLA EOMI, conjunctiva normal bilaterally HENT: Normocephalic, atraumatic,mucus membranes moist. Positive tenderness anterior left temple, no bruising, soft tissue swelling. No appreciable crepitus. No hemotympanum. Respiratory: Normal inspiratory effort Cardiovascular: Normal rate regular rhythm no murmurs rubs or gallops GI: nondistended skin: No rash, skin intact Musculoskeletal: no deformities Neurologic: Alert & oriented x  3, cranial nerves II through XII intact as tested. Finger-nose, heel shin within normal limits. Patient gets significantly light headed when standing up. Gait unsteady, patient is not sure if this is new or not. Psychiatric: Speech and behavior appropriate   ED Course   Medications - No data to display  Orders Placed This Encounter  Procedures  . CT HEAD WO CONTRAST    Standing Status:   Standing    Number of Occurrences:   1    Order Specific Question:   Symptom/Reason for Exam    Answer:   Head injury [[id number]]  . Orthostatic  vital signs    Standing Status:   Standing    Number of Occurrences:   1    No results found for this or any previous visit (from the past 24 hour(s)). Ct Head Wo Contrast  Result Date: 09/21/2015 CLINICAL DATA:  Injury to left temporal area 4 days ago, today with dizziness and unsteady gait. Left-sided headache. EXAM: CT HEAD WITHOUT CONTRAST TECHNIQUE: Contiguous axial images were obtained from the base of the skull through the vertex without intravenous contrast. COMPARISON:  Head CT and brain MRI dated 12/07/2014. FINDINGS: Brain: Ventricles are stable in size and configuration. Patchy areas of chronic small vessel ischemic changes again noted within the bilateral periventricular and subcortical white matter regions. There is no mass, hemorrhage, edema or other evidence of acute parenchymal abnormality. No extra-axial hemorrhage. Vascular: There are chronic calcified atherosclerotic changes of the large vessels at the skull base. Skull: No osseous fracture or dislocation. No acute or suspicious osseous lesion. Sinuses/Orbits: Visualized upper paranasal sinuses are clear. Mastoid air cells are clear. Visualized upper periorbital and retro-orbital soft tissues are unremarkable. Other: None IMPRESSION: 1. No acute findings. No intracranial mass, hemorrhage or edema. No skull fracture. 2. Chronic ischemic changes in the white matter. Electronically Signed   By: Bary RichardStan   Maynard M.D.   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 She is not sure if today's gait unsteadiness is new or different. Not sure if she's had difficulty walking or not over the past few days.  She does get significantly light headed when she stands up, so will get a head CT to rule out any subdural. Doubt intracranial hemorrhage at this point in time given that is 4 days out.  Reviewed imaging independently. No mass, hemorrhage, edema, skull fracture. Chronic ischemic changes in the white matter per radiology. See radiology report for details.  Presentation most consistent with a minor head injury with perhaps a concussion. She does not seem to have any life-threatening cause of her lightheadedness at this time. She is not orthostatic. She has no other complaints. The heart rate is regular on exam. Tylenol, cognitive rest, push fluids. She will follow-up with her primary care physician as needed. Discussed imaging, MDM, plan and followup with patient . Discussed sn/sx that should prompt return to the ED. Patient agrees with plan.   *This clinic note was created using Dragon dictation software. Therefore, there may be occasional mistakes despite careful proofreading.  ?    Domenick GongAshley Melonee Gerstel, MD 09/21/15 2059

## 2015-09-21 NOTE — Discharge Instructions (Signed)
Take Tylenol up to 1 g 4 times a day as needed for pain. Apply ice to the area. Give this about a week to completely resolve. Go to the ER for the signs and symptoms we discussed.

## 2016-01-17 ENCOUNTER — Telehealth (INDEPENDENT_AMBULATORY_CARE_PROVIDER_SITE_OTHER): Payer: Self-pay

## 2016-01-17 NOTE — Telephone Encounter (Signed)
Duplicate

## 2017-08-16 IMAGING — CR DG TOE GREAT 2+V*L*
3 series · 3 of 3 positions shown · non-contrast
Comparison: None.

CLINICAL DATA: Toe pain

EXAM:
LEFT GREAT TOE

[toe ap]
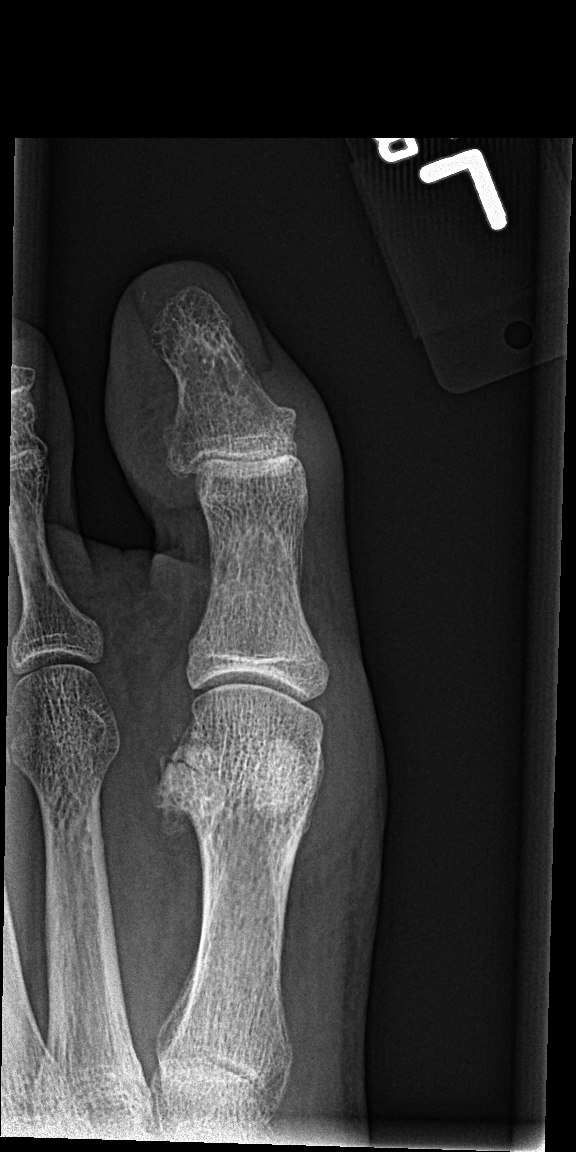

[toe obl]
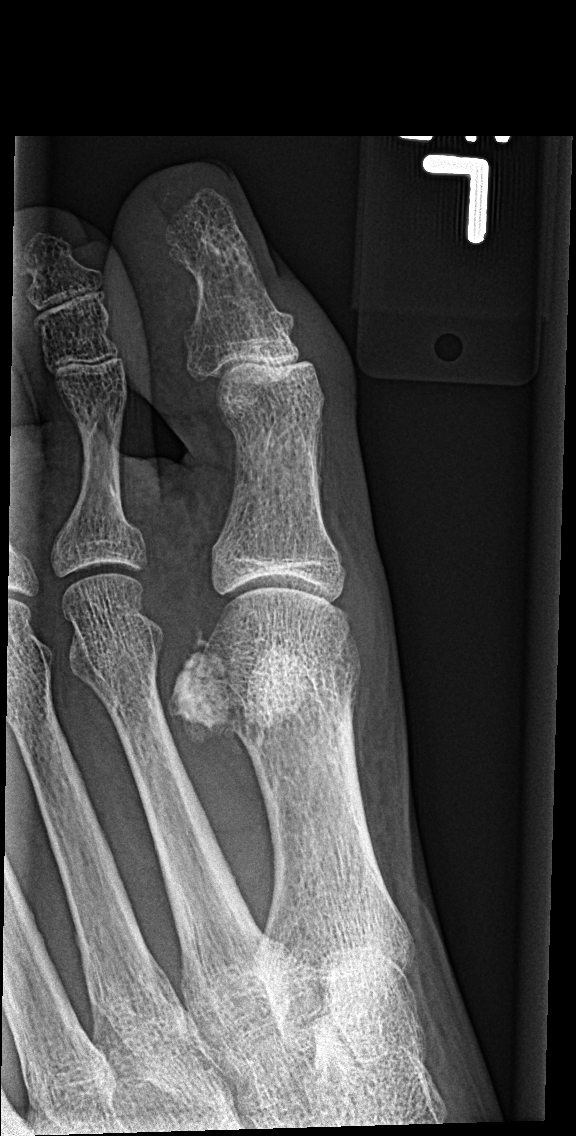

[toe lat]
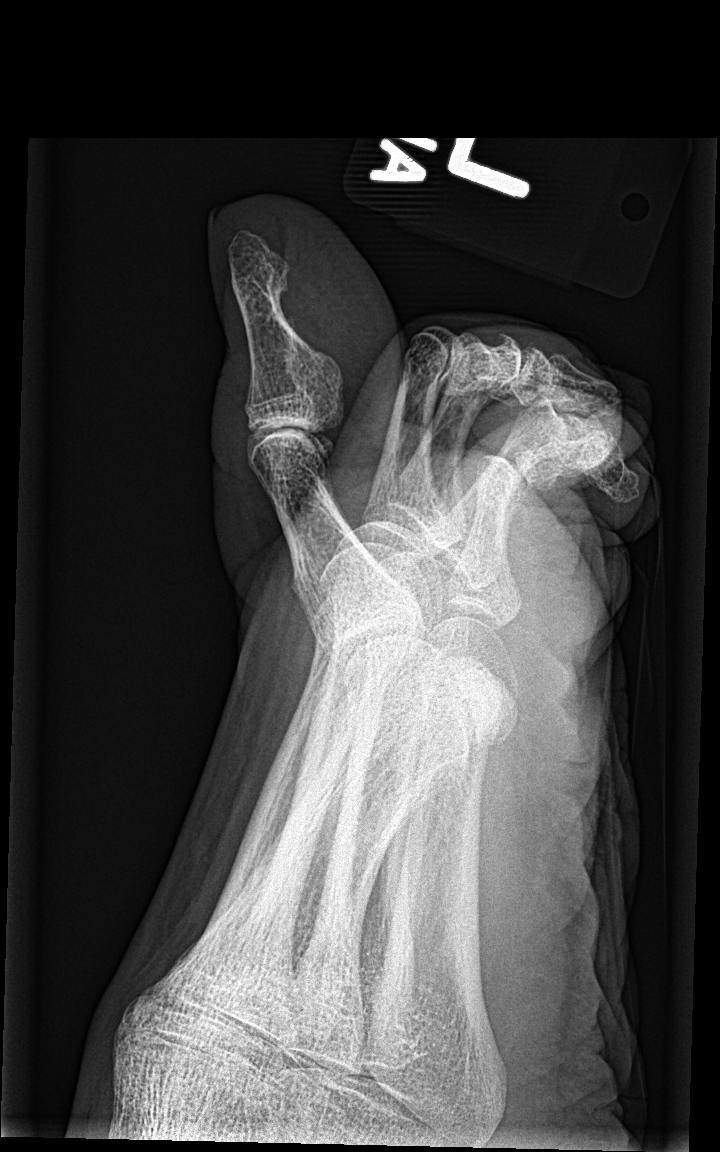

[3 of 3 positions shown; findings below may reference images not displayed]

FINDINGS: There is no evidence of fracture or dislocation. There is no
evidence of arthropathy or other focal bone abnormality. Soft
tissues are unremarkable.
IMPRESSION: Negative.

## 2018-06-21 IMAGING — CT CT HEAD W/O CM
4 series · 17 of 47 positions shown, 19 images · non-contrast
Comparison: Head CT and brain MRI dated 12/07/2014.

CLINICAL DATA: Injury to left temporal area 4 days ago, today with
dizziness and unsteady gait. Left-sided headache.

EXAM:
CT HEAD WITHOUT CONTRAST
TECHNIQUE: Contiguous axial images were obtained from the base of the skull
through the vertex without intravenous contrast.

[Series 2: head wo · axial · 0.39mm/px · z∈[-169,-69]mm · 6 of 29 slices shown, 8 images]
[im 5/29  brain]
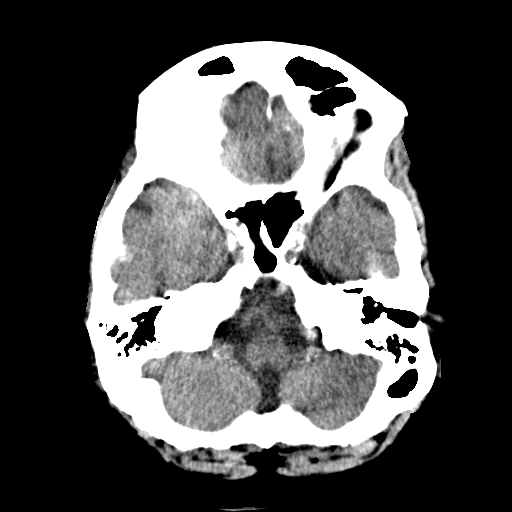
[im 5/29  bone]
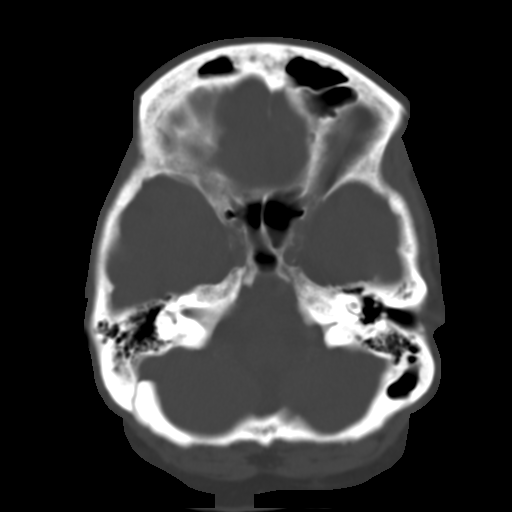
[im 9/29  brain]
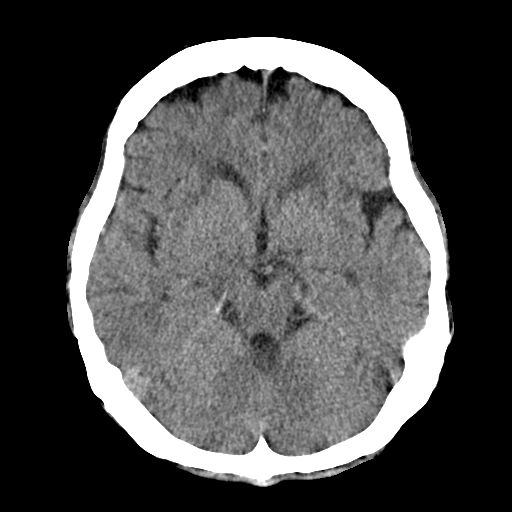
[im 13/29  brain]
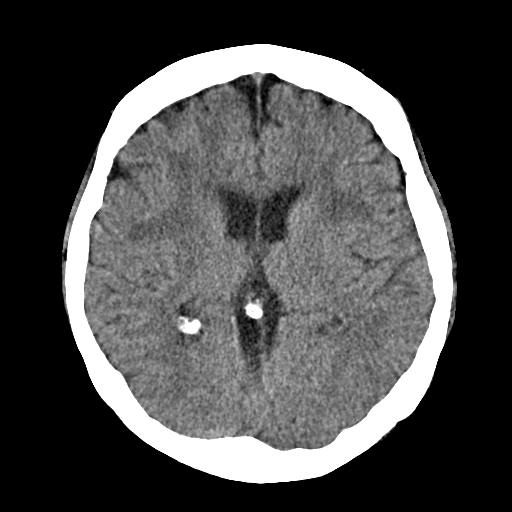
[im 17/29  brain]
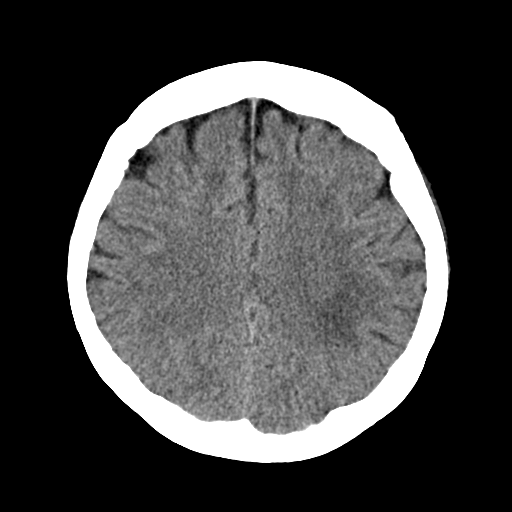
[im 21/29  brain]
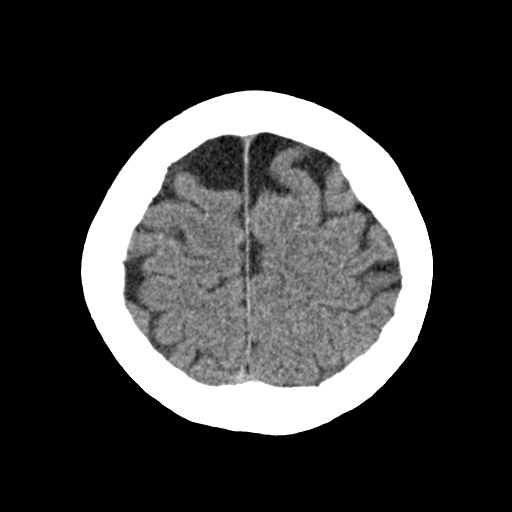
[im 21/29  bone]
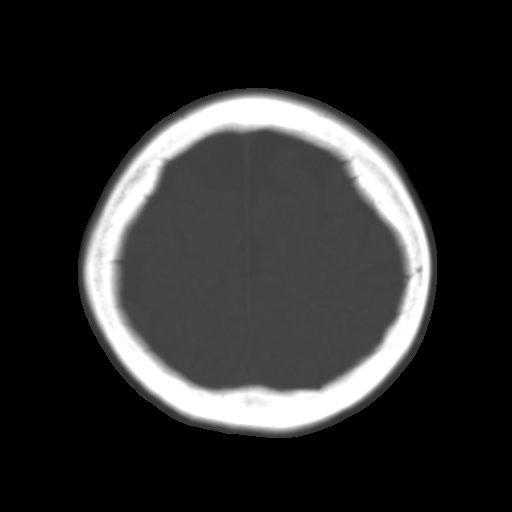
[im 25/29  brain]
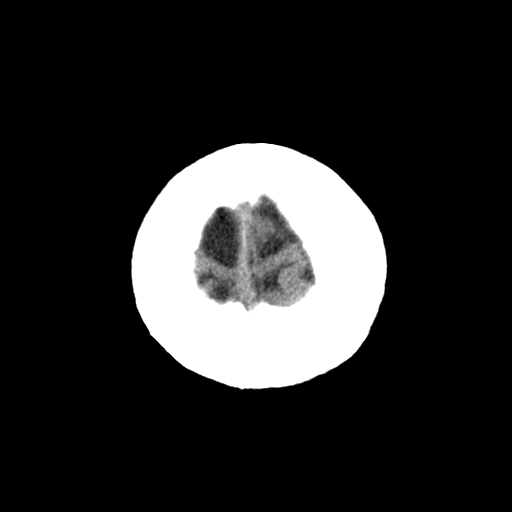

[Series 4: head bone · axial · 0.39mm/px · z∈[-179,-109]mm · 5 of 74 slices shown]
[im 7/74  bone]
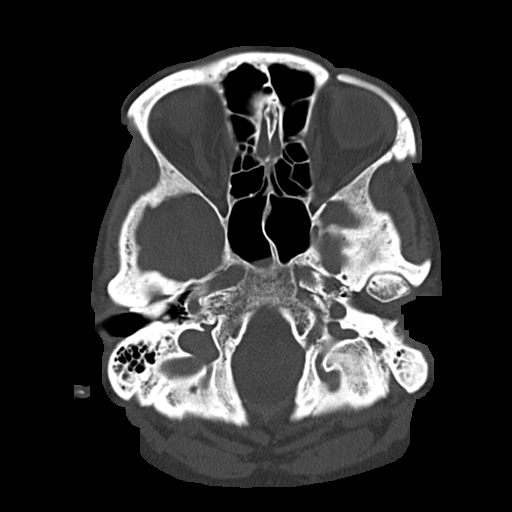
[im 14/74  bone]
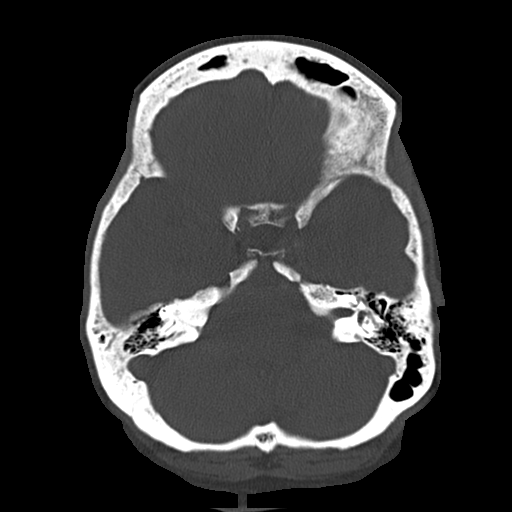
[im 25/74  bone]
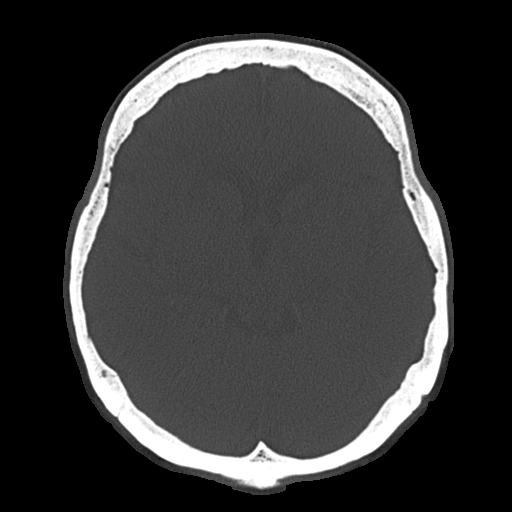
[im 32/74  bone]
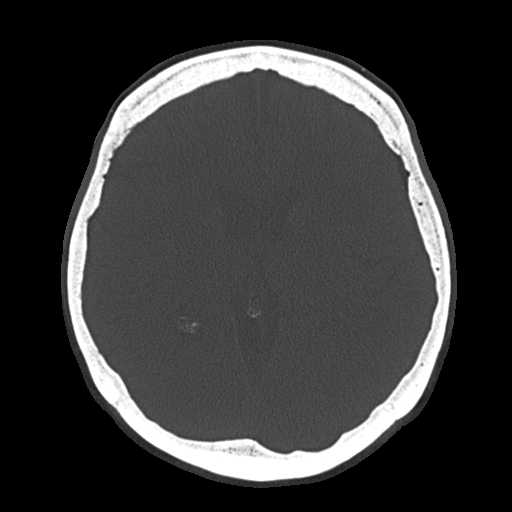
[im 42/74  bone]
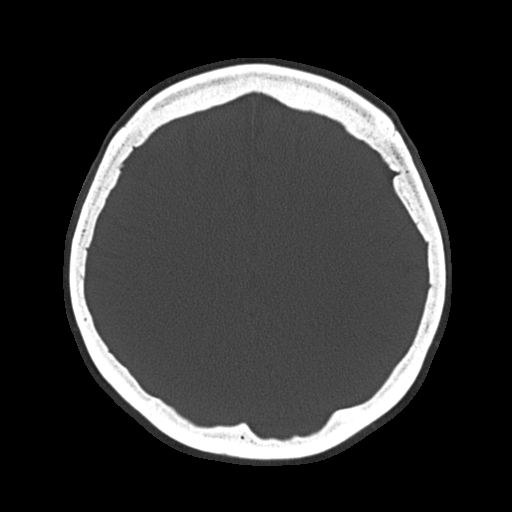

[Series 602: coronal · coronal · 0.39mm/px · 3 of 64 slices shown]
[im 22/64  brain]
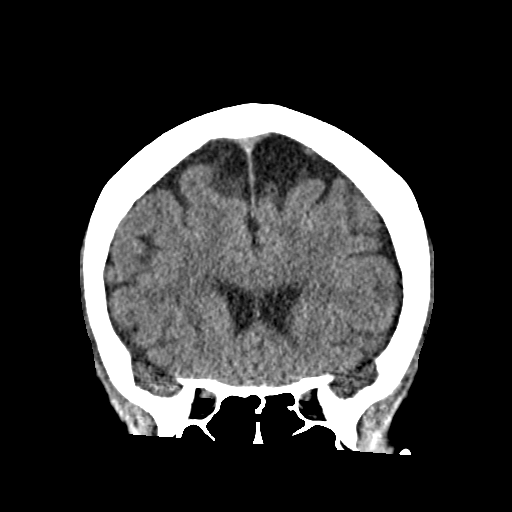
[im 29/64  brain]
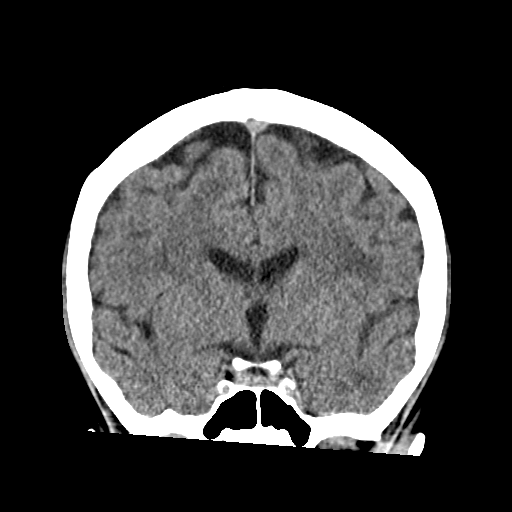
[im 36/64  brain]
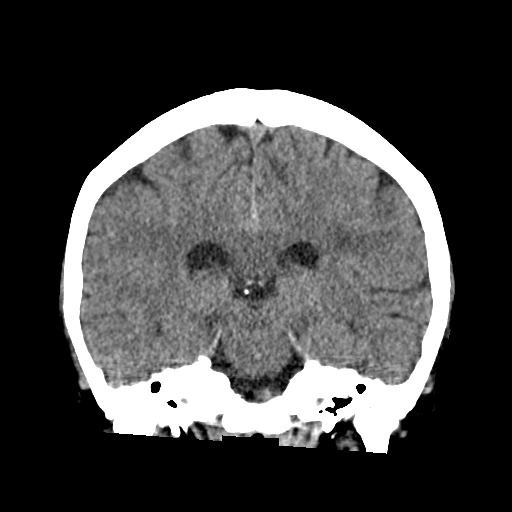

[Series 603: sagittal · sagittal · 0.39mm/px · 3 of 58 slices shown]
[im 20/58  brain]
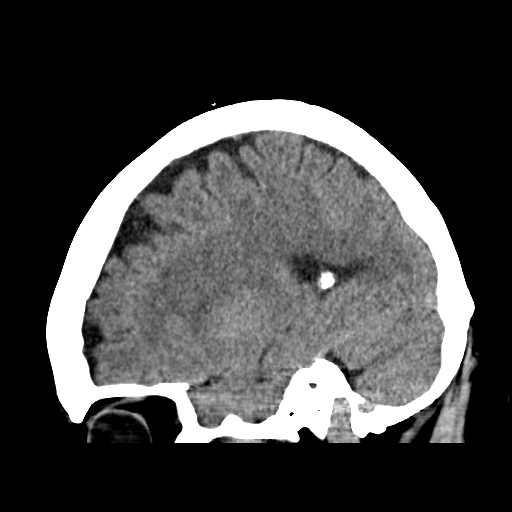
[im 29/58  brain]
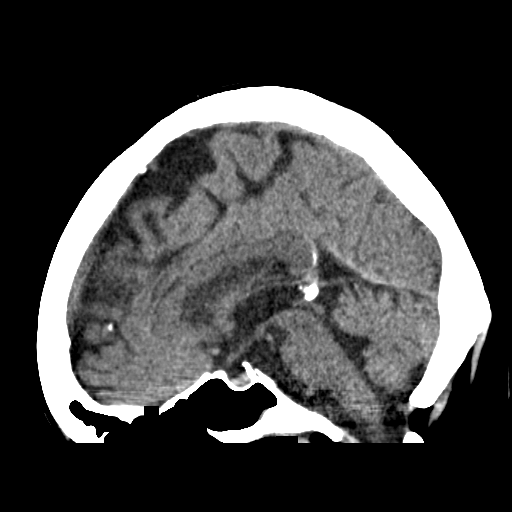
[im 39/58  brain]
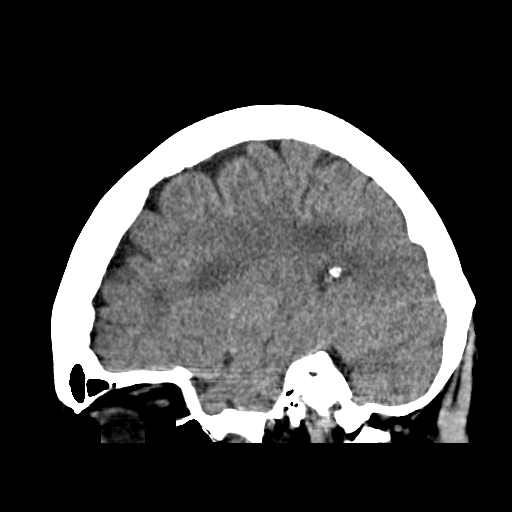

[17 of 47 positions shown; findings below may reference images not displayed]

FINDINGS: Brain: Ventricles are stable in size and configuration. Patchy areas
of chronic small vessel ischemic changes again noted within the
bilateral periventricular and subcortical white matter regions.
There is no mass, hemorrhage, edema or other evidence of acute
parenchymal abnormality. No extra-axial hemorrhage.

Vascular: There are chronic calcified atherosclerotic changes of the
large vessels at the skull base.

Skull: No osseous fracture or dislocation. No acute or suspicious
osseous lesion.

Sinuses/Orbits: Visualized upper paranasal sinuses are clear.
Mastoid air cells are clear. Visualized upper periorbital and
retro-orbital soft tissues are unremarkable.

Other: None
IMPRESSION: 1. No acute findings. No intracranial mass, hemorrhage or edema. No
skull fracture.
2. Chronic ischemic changes in the white matter.
